# Patient Record
Sex: Female | Born: 1940 | Race: White | Hispanic: No | Marital: Married | Smoking: Former smoker
Health system: Southern US, Community
[De-identification: ages and names within clinical notes are randomized; demographics above are authoritative.]

## PROBLEM LIST (undated history)

## (undated) DIAGNOSIS — R112 Nausea with vomiting, unspecified: Secondary | ICD-10-CM

## (undated) DIAGNOSIS — J984 Other disorders of lung: Secondary | ICD-10-CM

## (undated) DIAGNOSIS — I10 Essential (primary) hypertension: Secondary | ICD-10-CM

## (undated) DIAGNOSIS — T4145XA Adverse effect of unspecified anesthetic, initial encounter: Secondary | ICD-10-CM

## (undated) DIAGNOSIS — M069 Rheumatoid arthritis, unspecified: Secondary | ICD-10-CM

## (undated) DIAGNOSIS — K219 Gastro-esophageal reflux disease without esophagitis: Secondary | ICD-10-CM

## (undated) DIAGNOSIS — C3490 Malignant neoplasm of unspecified part of unspecified bronchus or lung: Secondary | ICD-10-CM

## (undated) DIAGNOSIS — J8410 Pulmonary fibrosis, unspecified: Secondary | ICD-10-CM

## (undated) DIAGNOSIS — I471 Supraventricular tachycardia, unspecified: Secondary | ICD-10-CM

## (undated) DIAGNOSIS — E785 Hyperlipidemia, unspecified: Secondary | ICD-10-CM

## (undated) DIAGNOSIS — T8859XA Other complications of anesthesia, initial encounter: Secondary | ICD-10-CM

## (undated) DIAGNOSIS — K5900 Constipation, unspecified: Secondary | ICD-10-CM

## (undated) DIAGNOSIS — D649 Anemia, unspecified: Secondary | ICD-10-CM

## (undated) DIAGNOSIS — I809 Phlebitis and thrombophlebitis of unspecified site: Secondary | ICD-10-CM

## (undated) DIAGNOSIS — I499 Cardiac arrhythmia, unspecified: Secondary | ICD-10-CM

## (undated) DIAGNOSIS — Z9889 Other specified postprocedural states: Secondary | ICD-10-CM

## (undated) DIAGNOSIS — Z9289 Personal history of other medical treatment: Secondary | ICD-10-CM

## (undated) HISTORY — PX: TONSILLECTOMY: SUR1361

## (undated) HISTORY — PX: COLONOSCOPY: SHX174

## (undated) HISTORY — PX: TRACHEOSTOMY: SUR1362

---

## 2012-01-01 HISTORY — PX: CATARACT EXTRACTION: SUR2

## 2012-12-17 ENCOUNTER — Other Ambulatory Visit: Payer: Self-pay | Admitting: Orthopedic Surgery

## 2013-01-08 ENCOUNTER — Encounter (HOSPITAL_COMMUNITY): Payer: Self-pay | Admitting: Pharmacy Technician

## 2013-01-09 ENCOUNTER — Ambulatory Visit (HOSPITAL_COMMUNITY)
Admission: RE | Admit: 2013-01-09 | Discharge: 2013-01-09 | Disposition: A | Discharge status: Home / Self Care | Payer: Medicare Other | Source: Ambulatory Visit | Attending: Orthopedic Surgery | Admitting: Orthopedic Surgery

## 2013-01-09 ENCOUNTER — Encounter (HOSPITAL_COMMUNITY)
Admission: RE | Admit: 2013-01-09 | Discharge: 2013-01-09 | Disposition: A | Discharge status: Home / Self Care | Payer: Medicare Other | Source: Ambulatory Visit | Attending: Orthopedic Surgery | Admitting: Orthopedic Surgery

## 2013-01-09 ENCOUNTER — Encounter (HOSPITAL_COMMUNITY): Payer: Self-pay

## 2013-01-09 DIAGNOSIS — Z79899 Other long term (current) drug therapy: Secondary | ICD-10-CM | POA: Insufficient documentation

## 2013-01-09 DIAGNOSIS — Z01812 Encounter for preprocedural laboratory examination: Secondary | ICD-10-CM | POA: Insufficient documentation

## 2013-01-09 DIAGNOSIS — Z01810 Encounter for preprocedural cardiovascular examination: Secondary | ICD-10-CM | POA: Insufficient documentation

## 2013-01-09 DIAGNOSIS — M1710 Unilateral primary osteoarthritis, unspecified knee: Secondary | ICD-10-CM | POA: Insufficient documentation

## 2013-01-09 DIAGNOSIS — I471 Supraventricular tachycardia, unspecified: Secondary | ICD-10-CM | POA: Insufficient documentation

## 2013-01-09 HISTORY — DX: Supraventricular tachycardia: I47.1

## 2013-01-09 HISTORY — DX: Hyperlipidemia, unspecified: E78.5

## 2013-01-09 HISTORY — DX: Supraventricular tachycardia, unspecified: I47.10

## 2013-01-09 HISTORY — DX: Gastro-esophageal reflux disease without esophagitis: K21.9

## 2013-01-09 HISTORY — DX: Rheumatoid arthritis, unspecified: M06.9

## 2013-01-09 HISTORY — DX: Essential (primary) hypertension: I10

## 2013-01-09 HISTORY — DX: Pulmonary fibrosis, unspecified: J84.10

## 2013-01-09 HISTORY — DX: Other disorders of lung: J98.4

## 2013-01-09 LAB — BASIC METABOLIC PANEL
Chloride: 99 mEq/L (ref 96–112)
Creatinine, Ser: 0.94 mg/dL (ref 0.50–1.10)
GFR calc Af Amer: 70 mL/min — ABNORMAL LOW (ref >90)
Sodium: 138 mEq/L (ref 135–145)

## 2013-01-09 LAB — CBC
HCT: 37.3 % (ref 36.0–46.0)
Platelets: 303 10*3/uL (ref 150–400)
RBC: 3.62 MIL/uL — ABNORMAL LOW (ref 3.87–5.11)
RDW: 12.3 % (ref 11.5–15.5)
WBC: 7.8 10*3/uL (ref 4.0–10.5)

## 2013-01-09 LAB — SURGICAL PCR SCREEN
MRSA, PCR: NEGATIVE
Staphylococcus aureus: NEGATIVE

## 2013-01-09 LAB — URINALYSIS, ROUTINE W REFLEX MICROSCOPIC
Bilirubin Urine: NEGATIVE
Ketones, ur: NEGATIVE mg/dL
Nitrite: NEGATIVE
Urobilinogen, UA: 0.2 mg/dL (ref 0.0–1.0)

## 2013-01-09 LAB — ABO/RH: ABO/RH(D): A NEG

## 2013-01-09 LAB — TYPE AND SCREEN: Antibody Screen: NEGATIVE

## 2013-01-09 NOTE — Progress Notes (Addendum)
Patient reports that she does not see a cardiologist for her PSVT that her PCP manages it and that she avoids caffeine. Requested records from [facility]

## 2013-01-09 NOTE — Pre-Procedure Instructions (Signed)
Dinorah Masullo  01/09/2013   Your procedure is scheduled on:  January 21  Report to [facility] at 05:30 AM.  Call this number if you have problems the morning of surgery: [phone]   Remember:   Do not eat food or drink liquids after midnight.   Take these medicines the morning of surgery with A SIP OF WATER: Verapamil   STOP Calcium, Multiple vitamins, vitamin E January 14  Do not wear jewelry, make-up or nail polish.  Do not wear lotions, powders, or perfumes. You may wear deodorant.  Do not shave 48 hours prior to surgery. Men may shave face and neck.  Do not bring valuables to the hospital.  Contacts, dentures or bridgework may not be worn into surgery.  Leave suitcase in the car. After surgery it may be brought to your room.  For patients admitted to the hospital, checkout time is 11:00 AM the day of discharge.   Special Instructions: Shower using CHG 2 nights before surgery and the night before surgery.  If you shower the day of surgery use CHG.  Use special wash - you have one bottle of CHG for all showers.  You should use approximately 1/3 of the bottle for each shower.   Please read over the following fact sheets that you were given: Pain Booklet, Coughing and Deep Breathing and Surgical Site Infection Prevention

## 2013-01-11 LAB — URINE CULTURE

## 2013-01-12 NOTE — Anesthesia Preprocedure Evaluation (Addendum)
Anesthesia Evaluation  Patient identified by MRN, date of birth, ID band Patient awake    Reviewed: Allergy & Precautions, H&P , NPO status , Patient's Chart, lab work & pertinent test results  Airway Mallampati: I TM Distance: >3 FB Neck ROM: full    Dental  (+) Teeth Intact and Dental Advisory Given   Pulmonary former smoker,  breath sounds clear to auscultation        Cardiovascular hypertension, Pt. on medications + dysrhythmias Rhythm:regular Rate:Normal     Neuro/Psych    GI/Hepatic GERD-  Medicated and Controlled,  Endo/Other    Renal/GU      Musculoskeletal  (+) Arthritis -, Rheumatoid disorders,    Abdominal   Peds  Hematology   Anesthesia Other Findings   Reproductive/Obstetrics                          Anesthesia Physical Anesthesia Plan  ASA: II  Anesthesia Plan: General   Post-op Pain Management:    Induction: Intravenous  Airway Management Planned: Oral ETT and LMA  Additional Equipment:   Intra-op Plan:   Post-operative Plan: Extubation in OR  Informed Consent: I have reviewed the patients History and Physical, chart, labs and discussed the procedure including the risks, benefits and alternatives for the proposed anesthesia with the patient or authorized representative who has indicated his/her understanding and acceptance.     Plan Discussed with: CRNA, Anesthesiologist and Surgeon  Anesthesia Plan Comments: (Please see my note re: anesthesia concerns due to history of childhood tracheostomy.  Shonna Chock, PA-C)       Anesthesia Quick Evaluation

## 2013-01-12 NOTE — Consult Note (Addendum)
Anesthesia chart review: Patient is a 72 year old female scheduled for left total knee arthroplasty by Dr. August Saucer on 01/21/2012. 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 PCP is from [facility].  Anesthesia type is posted as choice.  Due to her history of tracheostomy, she is concerned about the possible need for ETT.  I did not evaluate her on the day of surgery, but I did speak with her PAT RN while patient was still at her appointment regarding a possibility of spinal anesthesia versus GA with LMA versus GA with ETT (with the potential for use of a smaller ETT and/or specialized equipment such as a glidescope or fiberoptic scope if needed for better visualization.) On the day of surgery, she will talk further with her assigned anesthesiologist regarding her definitve anesthesia plan.  EKG on 01/09/13 showed NSR, cannot rule out anterior infarct (age undetermined).  CXR on 01/09/13 showed:  Prior granulomatous disease. No acute findings. Probable pleural thickening possibly in the oblique fissure on the left. This  should be confirmed with non emergent CT at some point to exclude the unlikely possibility of a mass.  Results called to Selena Batten at Dr. Diamantina Providence office.  She will have him review.  Preoperative labs noted.  Records from PCP requested, but are still pending.  I'll review once available.  Shonna Chock, PA-C 01/13/13 0940  Addendum: 01/13/13 1730 I reviewed records from PCP Dr. Wandalee Ferdinand.  According to his 11/05/12, he was aware of up-coming TKA.  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  He was planning to get a pre-operative CXR, so I did fax a copy of her recent CXR to Dr. Florentina Jenny office and spoke with nurse Jasmine December.  Overall,  I think her EKG appears stable since 06/12/07.  There are no additional cardiac records.  She has not had any sustained arrhythmias and remains on a calcium channel blocker.  Would anticipate she could proceed as planned in no acute changes.

## 2013-01-19 MED ORDER — CEFAZOLIN SODIUM-DEXTROSE 2-3 GM-% IV SOLR
2.0000 g | INTRAVENOUS | Status: AC
  Administered 2013-01-20: 2 g via INTRAVENOUS
  Filled 2013-01-19 (×3): qty 50

## 2013-01-20 ENCOUNTER — Encounter (HOSPITAL_COMMUNITY): Payer: Self-pay | Admitting: Vascular Surgery

## 2013-01-20 ENCOUNTER — Encounter (HOSPITAL_COMMUNITY): Payer: Self-pay

## 2013-01-20 ENCOUNTER — Encounter (HOSPITAL_COMMUNITY)
Admission: RE | Disposition: A | Discharge status: Home Health | Payer: Self-pay | Source: Ambulatory Visit | Attending: Orthopedic Surgery

## 2013-01-20 ENCOUNTER — Inpatient Hospital Stay (HOSPITAL_COMMUNITY)
Admission: RE | Admit: 2013-01-20 | Discharge: 2013-01-23 | DRG: 470 | Disposition: A | Discharge status: Home Health | Payer: Medicare Other | Source: Ambulatory Visit | Attending: Orthopedic Surgery | Admitting: Orthopedic Surgery

## 2013-01-20 ENCOUNTER — Inpatient Hospital Stay (HOSPITAL_COMMUNITY): Payer: Medicare Other | Admitting: Vascular Surgery

## 2013-01-20 DIAGNOSIS — Z888 Allergy status to other drugs, medicaments and biological substances status: Secondary | ICD-10-CM

## 2013-01-20 DIAGNOSIS — K219 Gastro-esophageal reflux disease without esophagitis: Secondary | ICD-10-CM | POA: Diagnosis present

## 2013-01-20 DIAGNOSIS — I471 Supraventricular tachycardia, unspecified: Secondary | ICD-10-CM | POA: Diagnosis present

## 2013-01-20 DIAGNOSIS — Z79899 Other long term (current) drug therapy: Secondary | ICD-10-CM

## 2013-01-20 DIAGNOSIS — E785 Hyperlipidemia, unspecified: Secondary | ICD-10-CM | POA: Diagnosis present

## 2013-01-20 DIAGNOSIS — M1710 Unilateral primary osteoarthritis, unspecified knee: Principal | ICD-10-CM | POA: Diagnosis present

## 2013-01-20 DIAGNOSIS — I10 Essential (primary) hypertension: Secondary | ICD-10-CM | POA: Diagnosis present

## 2013-01-20 DIAGNOSIS — Z87891 Personal history of nicotine dependence: Secondary | ICD-10-CM

## 2013-01-20 HISTORY — PX: TOTAL KNEE ARTHROPLASTY: SHX125

## 2013-01-20 LAB — PROTIME-INR
INR: 1 (ref 0.00–1.49)
Prothrombin Time: 13.1 seconds (ref 11.6–15.2)

## 2013-01-20 SURGERY — ARTHROPLASTY, KNEE, TOTAL
Anesthesia: General | Site: Knee | Laterality: Left | Wound class: Clean

## 2013-01-20 MED ORDER — DIPHENHYDRAMINE HCL 12.5 MG/5ML PO ELIX: 12.5000 mg | ORAL_SOLUTION | Freq: Four times a day (QID) | ORAL | Status: DC | PRN

## 2013-01-20 MED ORDER — NALOXONE HCL 0.4 MG/ML IJ SOLN: 0.4000 mg | INTRAMUSCULAR | Status: DC | PRN

## 2013-01-20 MED ORDER — ONDANSETRON HCL 4 MG/2ML IJ SOLN: 4.0000 mg | Freq: Four times a day (QID) | INTRAMUSCULAR | Status: DC | PRN

## 2013-01-20 MED ORDER — PANTOPRAZOLE SODIUM 40 MG PO TBEC
40.0000 mg | DELAYED_RELEASE_TABLET | Freq: Every day | ORAL | Status: DC
  Administered 2013-01-21 – 2013-01-22 (×2): 40 mg via ORAL
  Filled 2013-01-20 (×2): qty 1

## 2013-01-20 MED ORDER — DEXAMETHASONE SODIUM PHOSPHATE 4 MG/ML IJ SOLN: INTRAMUSCULAR | Status: DC | PRN

## 2013-01-20 MED ORDER — PROMETHAZINE HCL 25 MG/ML IJ SOLN
12.5000 mg | Freq: Four times a day (QID) | INTRAMUSCULAR | Status: DC | PRN
  Administered 2013-01-20: 6.25 mg via INTRAVENOUS

## 2013-01-20 MED ORDER — MIDAZOLAM HCL 5 MG/5ML IJ SOLN
INTRAMUSCULAR | Status: DC | PRN
  Administered 2013-01-20 (×2): 1 mg via INTRAVENOUS

## 2013-01-20 MED ORDER — POTASSIUM CHLORIDE IN NACL 20-0.9 MEQ/L-% IV SOLN
INTRAVENOUS | Status: DC
  Administered 2013-01-20: 15:00:00 via INTRAVENOUS
  Filled 2013-01-20 (×2): qty 1000

## 2013-01-20 MED ORDER — ROCURONIUM BROMIDE 100 MG/10ML IV SOLN
INTRAVENOUS | Status: DC | PRN
  Administered 2013-01-20: 30 mg via INTRAVENOUS

## 2013-01-20 MED ORDER — LIDOCAINE HCL (CARDIAC) 20 MG/ML IV SOLN
INTRAVENOUS | Status: DC | PRN
  Administered 2013-01-20: 80 mg via INTRAVENOUS

## 2013-01-20 MED ORDER — MENTHOL 3 MG MT LOZG: 1.0000 | LOZENGE | OROMUCOSAL | Status: DC | PRN

## 2013-01-20 MED ORDER — TRIAMTERENE-HCTZ 37.5-25 MG PO TABS
1.0000 | ORAL_TABLET | Freq: Every day | ORAL | Status: DC
  Administered 2013-01-21 – 2013-01-23 (×3): 1 via ORAL
  Filled 2013-01-20 (×4): qty 1

## 2013-01-20 MED ORDER — CLONIDINE HCL (ANALGESIA) 100 MCG/ML EP SOLN
EPIDURAL | Status: DC | PRN
  Administered 2013-01-20: .9 mL via INTRA_ARTICULAR

## 2013-01-20 MED ORDER — VERAPAMIL HCL ER 240 MG PO TBCR
240.0000 mg | EXTENDED_RELEASE_TABLET | Freq: Every day | ORAL | Status: DC
  Administered 2013-01-21 – 2013-01-23 (×3): 240 mg via ORAL
  Filled 2013-01-20 (×3): qty 1

## 2013-01-20 MED ORDER — DEXTROSE 5 % IV SOLN
500.0000 mg | Freq: Four times a day (QID) | INTRAVENOUS | Status: DC | PRN
  Administered 2013-01-20: 500 mg via INTRAVENOUS
  Filled 2013-01-20: qty 5

## 2013-01-20 MED ORDER — SODIUM CHLORIDE 0.9 % IR SOLN
Status: DC | PRN
  Administered 2013-01-20: 3000 mL

## 2013-01-20 MED ORDER — ACETAMINOPHEN 325 MG PO TABS
650.0000 mg | ORAL_TABLET | Freq: Four times a day (QID) | ORAL | Status: DC | PRN
  Administered 2013-01-22 (×3): 650 mg via ORAL
  Filled 2013-01-20 (×3): qty 2

## 2013-01-20 MED ORDER — WARFARIN SODIUM 5 MG PO TABS
5.0000 mg | ORAL_TABLET | Freq: Once | ORAL | Status: AC
  Administered 2013-01-20: 5 mg via ORAL
  Filled 2013-01-20: qty 1

## 2013-01-20 MED ORDER — LIDOCAINE HCL 4 % MT SOLN
OROMUCOSAL | Status: DC | PRN
  Administered 2013-01-20: 4 mL via TOPICAL

## 2013-01-20 MED ORDER — WARFARIN VIDEO: Freq: Once | Status: DC

## 2013-01-20 MED ORDER — SODIUM CHLORIDE 0.9 % IJ SOLN: 9.0000 mL | INTRAMUSCULAR | Status: DC | PRN

## 2013-01-20 MED ORDER — ONDANSETRON HCL 4 MG/2ML IJ SOLN
4.0000 mg | Freq: Once | INTRAMUSCULAR | Status: DC | PRN
Start: 2013-01-20 — End: 2013-01-20

## 2013-01-20 MED ORDER — BUPIVACAINE HCL (PF) 0.5 % IJ SOLN
INTRAMUSCULAR | Status: AC
  Filled 2013-01-20: qty 30

## 2013-01-20 MED ORDER — METOCLOPRAMIDE HCL 5 MG/ML IJ SOLN: 5.0000 mg | Freq: Three times a day (TID) | INTRAMUSCULAR | Status: DC | PRN

## 2013-01-20 MED ORDER — PHENOL 1.4 % MT LIQD: 1.0000 | OROMUCOSAL | Status: DC | PRN

## 2013-01-20 MED ORDER — BUPIVACAINE HCL (PF) 0.5 % IJ SOLN
INTRAMUSCULAR | Status: DC | PRN
  Administered 2013-01-20: 30 mL via INTRA_ARTICULAR

## 2013-01-20 MED ORDER — BUPIVACAINE-EPINEPHRINE PF 0.5-1:200000 % IJ SOLN
INTRAMUSCULAR | Status: AC
  Filled 2013-01-20: qty 30

## 2013-01-20 MED ORDER — COUMADIN BOOK
Freq: Once | Status: AC
  Administered 2013-01-20: 17:00:00
  Filled 2013-01-20: qty 1

## 2013-01-20 MED ORDER — CLONIDINE HCL (ANALGESIA) 100 MCG/ML EP SOLN
150.0000 ug | Freq: Once | EPIDURAL | Status: DC
  Filled 2013-01-20: qty 1.5

## 2013-01-20 MED ORDER — 0.9 % SODIUM CHLORIDE (POUR BTL) OPTIME
TOPICAL | Status: DC | PRN
  Administered 2013-01-20: 1000 mL

## 2013-01-20 MED ORDER — CALCIUM CARBONATE-VITAMIN D 500-200 MG-UNIT PO TABS
2.0000 | ORAL_TABLET | Freq: Every day | ORAL | Status: DC
  Administered 2013-01-21 – 2013-01-23 (×3): 2 via ORAL
  Filled 2013-01-20 (×3): qty 2

## 2013-01-20 MED ORDER — ACETAMINOPHEN 10 MG/ML IV SOLN
INTRAVENOUS | Status: AC
  Administered 2013-01-20: 1000 mg via INTRAVENOUS
  Filled 2013-01-20: qty 100

## 2013-01-20 MED ORDER — GLYCOPYRROLATE 0.2 MG/ML IJ SOLN
INTRAMUSCULAR | Status: DC | PRN
  Administered 2013-01-20: 0.4 mg via INTRAVENOUS

## 2013-01-20 MED ORDER — ONDANSETRON HCL 4 MG/2ML IJ SOLN
INTRAMUSCULAR | Status: DC | PRN
  Administered 2013-01-20 (×2): 4 mg via INTRAVENOUS

## 2013-01-20 MED ORDER — SCOPOLAMINE 1 MG/3DAYS TD PT72
1.0000 | MEDICATED_PATCH | Freq: Once | TRANSDERMAL | Status: DC
  Administered 2013-01-20: 1.5 mg via TRANSDERMAL

## 2013-01-20 MED ORDER — HYDROMORPHONE HCL PF 1 MG/ML IJ SOLN: 0.2500 mg | INTRAMUSCULAR | Status: DC | PRN

## 2013-01-20 MED ORDER — WARFARIN - PHARMACIST DOSING INPATIENT: Freq: Every day | Status: DC

## 2013-01-20 MED ORDER — MORPHINE SULFATE (PF) 1 MG/ML IV SOLN
INTRAVENOUS | Status: AC
  Filled 2013-01-20: qty 25

## 2013-01-20 MED ORDER — SIMVASTATIN 20 MG PO TABS
20.0000 mg | ORAL_TABLET | Freq: Every evening | ORAL | Status: DC
  Filled 2013-01-20: qty 1

## 2013-01-20 MED ORDER — ATORVASTATIN CALCIUM 10 MG PO TABS
10.0000 mg | ORAL_TABLET | Freq: Every day | ORAL | Status: DC
  Administered 2013-01-22: 10 mg via ORAL
  Filled 2013-01-20 (×4): qty 1

## 2013-01-20 MED ORDER — LACTATED RINGERS IV SOLN
INTRAVENOUS | Status: DC | PRN
  Administered 2013-01-20 (×2): via INTRAVENOUS

## 2013-01-20 MED ORDER — MORPHINE SULFATE 4 MG/ML IJ SOLN
INTRAMUSCULAR | Status: AC
  Filled 2013-01-20: qty 2

## 2013-01-20 MED ORDER — METOCLOPRAMIDE HCL 10 MG PO TABS: 5.0000 mg | ORAL_TABLET | Freq: Three times a day (TID) | ORAL | Status: DC | PRN

## 2013-01-20 MED ORDER — ARTIFICIAL TEARS OP OINT
TOPICAL_OINTMENT | OPHTHALMIC | Status: DC | PRN
  Administered 2013-01-20: 1 via OPHTHALMIC

## 2013-01-20 MED ORDER — MORPHINE SULFATE (PF) 1 MG/ML IV SOLN
INTRAVENOUS | Status: DC
  Administered 2013-01-20: 4 mg via INTRAVENOUS
  Administered 2013-01-21: 08:00:00 via INTRAVENOUS
  Administered 2013-01-21: 4 mg via INTRAVENOUS
  Filled 2013-01-20: qty 25

## 2013-01-20 MED ORDER — ONDANSETRON HCL 4 MG/2ML IJ SOLN
INTRAMUSCULAR | Status: AC
  Administered 2013-01-20: 2 mg
  Filled 2013-01-20: qty 2

## 2013-01-20 MED ORDER — DIPHENHYDRAMINE HCL 50 MG/ML IJ SOLN: 12.5000 mg | Freq: Four times a day (QID) | INTRAMUSCULAR | Status: DC | PRN

## 2013-01-20 MED ORDER — MORPHINE SULFATE 4 MG/ML IJ SOLN
INTRAMUSCULAR | Status: DC | PRN
  Administered 2013-01-20: 8 mg via INTRAVENOUS

## 2013-01-20 MED ORDER — OXYCODONE HCL 5 MG PO TABS
5.0000 mg | ORAL_TABLET | ORAL | Status: DC | PRN
  Administered 2013-01-21: 10 mg via ORAL
  Administered 2013-01-21 – 2013-01-22 (×4): 5 mg via ORAL
  Administered 2013-01-22 (×3): 10 mg via ORAL
  Administered 2013-01-22: 5 mg via ORAL
  Administered 2013-01-23: 10 mg via ORAL
  Administered 2013-01-23: 5 mg via ORAL
  Filled 2013-01-20 (×3): qty 1
  Filled 2013-01-20 (×2): qty 2
  Filled 2013-01-20: qty 1
  Filled 2013-01-20: qty 2
  Filled 2013-01-20 (×2): qty 1
  Filled 2013-01-20 (×2): qty 2

## 2013-01-20 MED ORDER — CEFAZOLIN SODIUM 1-5 GM-% IV SOLN
1.0000 g | Freq: Three times a day (TID) | INTRAVENOUS | Status: AC
  Administered 2013-01-20 (×2): 1 g via INTRAVENOUS
  Filled 2013-01-20 (×2): qty 50

## 2013-01-20 MED ORDER — CHLORHEXIDINE GLUCONATE 4 % EX LIQD: 60.0000 mL | Freq: Once | CUTANEOUS | Status: DC

## 2013-01-20 MED ORDER — PROMETHAZINE HCL 25 MG/ML IJ SOLN
INTRAMUSCULAR | Status: AC
  Filled 2013-01-20: qty 1

## 2013-01-20 MED ORDER — ONDANSETRON HCL 4 MG/2ML IJ SOLN: 2.0000 mg | Freq: Once | INTRAMUSCULAR | Status: DC

## 2013-01-20 MED ORDER — MORPHINE SULFATE (PF) 1 MG/ML IV SOLN
INTRAVENOUS | Status: DC
  Administered 2013-01-20: 12:00:00 via INTRAVENOUS

## 2013-01-20 MED ORDER — NEOSTIGMINE METHYLSULFATE 1 MG/ML IJ SOLN
INTRAMUSCULAR | Status: DC | PRN
  Administered 2013-01-20: 3 mg via INTRAVENOUS

## 2013-01-20 MED ORDER — ONDANSETRON HCL 4 MG PO TABS: 4.0000 mg | ORAL_TABLET | Freq: Four times a day (QID) | ORAL | Status: DC | PRN

## 2013-01-20 MED ORDER — ONDANSETRON HCL 4 MG/2ML IJ SOLN: 4.0000 mg | Freq: Once | INTRAMUSCULAR | Status: DC | PRN

## 2013-01-20 MED ORDER — MIDAZOLAM HCL 5 MG/5ML IJ SOLN: INTRAMUSCULAR | Status: DC | PRN

## 2013-01-20 MED ORDER — FENTANYL CITRATE 0.05 MG/ML IJ SOLN
INTRAMUSCULAR | Status: DC | PRN
  Administered 2013-01-20 (×8): 50 ug via INTRAVENOUS
  Administered 2013-01-20: 100 ug via INTRAVENOUS

## 2013-01-20 MED ORDER — METHOCARBAMOL 500 MG PO TABS: 500.0000 mg | ORAL_TABLET | Freq: Four times a day (QID) | ORAL | Status: DC | PRN

## 2013-01-20 MED ORDER — PROPOFOL 10 MG/ML IV BOLUS
INTRAVENOUS | Status: DC | PRN
  Administered 2013-01-20: 160 mg via INTRAVENOUS

## 2013-01-20 MED ORDER — DEXAMETHASONE SODIUM PHOSPHATE 4 MG/ML IJ SOLN
INTRAMUSCULAR | Status: DC | PRN
  Administered 2013-01-20: 4 mg via INTRAVENOUS

## 2013-01-20 MED ORDER — ADULT MULTIVITAMIN W/MINERALS CH
1.0000 | ORAL_TABLET | Freq: Two times a day (BID) | ORAL | Status: DC
  Administered 2013-01-21 – 2013-01-23 (×5): 1 via ORAL
  Filled 2013-01-20 (×7): qty 1

## 2013-01-20 MED ORDER — ACETAMINOPHEN 650 MG RE SUPP: 650.0000 mg | Freq: Four times a day (QID) | RECTAL | Status: DC | PRN

## 2013-01-20 MED ORDER — CALCIUM 1200 1200-1000 MG-UNIT PO CHEW: CHEWABLE_TABLET | Freq: Every morning | ORAL | Status: DC

## 2013-01-20 SURGICAL SUPPLY — 73 items
BANDAGE ELASTIC 4 VELCRO ST LF (GAUZE/BANDAGES/DRESSINGS) ×2 IMPLANT
BANDAGE ELASTIC 6 VELCRO ST LF (GAUZE/BANDAGES/DRESSINGS) ×2 IMPLANT
BANDAGE ESMARK 6X9 LF (GAUZE/BANDAGES/DRESSINGS) ×1 IMPLANT
BLADE SAG 18X100X1.27 (BLADE) ×2 IMPLANT
BLADE SAW SGTL 13.0X1.19X90.0M (BLADE) IMPLANT
BNDG COHESIVE 6X5 TAN STRL LF (GAUZE/BANDAGES/DRESSINGS) ×2 IMPLANT
BNDG ELASTIC 6X10 VLCR STRL LF (GAUZE/BANDAGES/DRESSINGS) ×6 IMPLANT
BNDG ESMARK 6X9 LF (GAUZE/BANDAGES/DRESSINGS) ×2
BOWL SMART MIX CTS (DISPOSABLE) ×2 IMPLANT
CEMENT BONE SIMPLEX SPEEDSET (Cement) ×4 IMPLANT
CLOTH BEACON ORANGE TIMEOUT ST (SAFETY) ×2 IMPLANT
COVER BACK TABLE 24X17X13 BIG (DRAPES) ×2 IMPLANT
COVER SURGICAL LIGHT HANDLE (MISCELLANEOUS) ×2 IMPLANT
CUFF TOURNIQUET SINGLE 34IN LL (TOURNIQUET CUFF) ×2 IMPLANT
CUFF TOURNIQUET SINGLE 44IN (TOURNIQUET CUFF) IMPLANT
DRAPE INCISE IOBAN 66X45 STRL (DRAPES) IMPLANT
DRAPE ORTHO SPLIT 77X108 STRL (DRAPES) ×3
DRAPE PROXIMA HALF (DRAPES) ×2 IMPLANT
DRAPE SURG ORHT 6 SPLT 77X108 (DRAPES) ×3 IMPLANT
DRAPE U-SHAPE 47X51 STRL (DRAPES) ×2 IMPLANT
DRAPE X-RAY CASS 24X20 (DRAPES) IMPLANT
DRSG PAD ABDOMINAL 8X10 ST (GAUZE/BANDAGES/DRESSINGS) ×2 IMPLANT
DURAPREP 26ML APPLICATOR (WOUND CARE) ×2 IMPLANT
ELECT REM PT RETURN 9FT ADLT (ELECTROSURGICAL) ×2
ELECTRODE REM PT RTRN 9FT ADLT (ELECTROSURGICAL) ×1 IMPLANT
EVACUATOR 1/8 PVC DRAIN (DRAIN) ×2 IMPLANT
FACESHIELD LNG OPTICON STERILE (SAFETY) ×2 IMPLANT
GAUZE XEROFORM 5X9 LF (GAUZE/BANDAGES/DRESSINGS) ×2 IMPLANT
GLOVE BIO SURGEON ST LM GN SZ9 (GLOVE) IMPLANT
GLOVE BIOGEL PI IND STRL 8 (GLOVE) ×1 IMPLANT
GLOVE BIOGEL PI INDICATOR 8 (GLOVE) ×1
GLOVE SURG ORTHO 8.0 STRL STRW (GLOVE) ×2 IMPLANT
GOWN PREVENTION PLUS LG XLONG (DISPOSABLE) ×2 IMPLANT
GOWN PREVENTION PLUS XLARGE (GOWN DISPOSABLE) ×6 IMPLANT
GOWN STRL NON-REIN LRG LVL3 (GOWN DISPOSABLE) ×6 IMPLANT
HANDPIECE INTERPULSE COAX TIP (DISPOSABLE) ×1
HOOD PEEL AWAY FACE SHEILD DIS (HOOD) ×10 IMPLANT
IMMOBILIZER KNEE 20 (SOFTGOODS)
IMMOBILIZER KNEE 20 THIGH 36 (SOFTGOODS) IMPLANT
IMMOBILIZER KNEE 22 UNIV (SOFTGOODS) ×2 IMPLANT
IMMOBILIZER KNEE 24 THIGH 36 (MISCELLANEOUS) IMPLANT
IMMOBILIZER KNEE 24 UNIV (MISCELLANEOUS)
KIT BASIN OR (CUSTOM PROCEDURE TRAY) ×2 IMPLANT
KIT ROOM TURNOVER OR (KITS) ×2 IMPLANT
MANIFOLD NEPTUNE II (INSTRUMENTS) ×2 IMPLANT
MARKER SPHERE PSV REFLC THRD 5 (MARKER) IMPLANT
NEEDLE 18GX1X1/2 (RX/OR ONLY) (NEEDLE) ×2 IMPLANT
NEEDLE SPNL 18GX3.5 QUINCKE PK (NEEDLE) ×2 IMPLANT
NS IRRIG 1000ML POUR BTL (IV SOLUTION) ×2 IMPLANT
PACK TOTAL JOINT (CUSTOM PROCEDURE TRAY) ×2 IMPLANT
PAD ARMBOARD 7.5X6 YLW CONV (MISCELLANEOUS) ×4 IMPLANT
PAD CAST 4YDX4 CTTN HI CHSV (CAST SUPPLIES) ×1 IMPLANT
PADDING CAST COTTON 4X4 STRL (CAST SUPPLIES) ×1
PADDING CAST COTTON 6X4 STRL (CAST SUPPLIES) ×2 IMPLANT
PIN SCHANZ 4MM 130MM (PIN) IMPLANT
RUBBERBAND STERILE (MISCELLANEOUS) ×2 IMPLANT
SET HNDPC FAN SPRY TIP SCT (DISPOSABLE) ×1 IMPLANT
SPONGE GAUZE 4X4 12PLY (GAUZE/BANDAGES/DRESSINGS) ×2 IMPLANT
SPONGE LAP 18X18 X RAY DECT (DISPOSABLE) IMPLANT
STAPLER VISISTAT 35W (STAPLE) ×2 IMPLANT
SUCTION FRAZIER TIP 10 FR DISP (SUCTIONS) ×2 IMPLANT
SUT ETHILON 3 0 PS 1 (SUTURE) ×4 IMPLANT
SUT VIC AB 0 CTB1 27 (SUTURE) ×6 IMPLANT
SUT VIC AB 1 CT1 27 (SUTURE) ×5
SUT VIC AB 1 CT1 27XBRD ANBCTR (SUTURE) ×5 IMPLANT
SUT VIC AB 2-0 CT1 27 (SUTURE) ×3
SUT VIC AB 2-0 CT1 TAPERPNT 27 (SUTURE) ×3 IMPLANT
SYR 30ML SLIP (SYRINGE) ×2 IMPLANT
SYR TB 1ML LUER SLIP (SYRINGE) ×2 IMPLANT
TOWEL OR 17X24 6PK STRL BLUE (TOWEL DISPOSABLE) ×2 IMPLANT
TOWEL OR 17X26 10 PK STRL BLUE (TOWEL DISPOSABLE) ×4 IMPLANT
TRAY FOLEY CATH 14FR (SET/KITS/TRAYS/PACK) ×2 IMPLANT
WATER STERILE IRR 1000ML POUR (IV SOLUTION) ×6 IMPLANT

## 2013-01-20 NOTE — Anesthesia Procedure Notes (Signed)
Procedure Name: Intubation Date/Time: 01/20/2013 7:43 AM Performed by: Lovie Chol Pre-anesthesia Checklist: Patient identified, Emergency Drugs available, Suction available, Patient being monitored and Timeout performed Patient Re-evaluated:Patient Re-evaluated prior to inductionOxygen Delivery Method: Circle system utilized Preoxygenation: Pre-oxygenation with 100% oxygen Intubation Type: IV induction Ventilation: Mask ventilation without difficulty Laryngoscope Size: Miller and 2 Grade View: Grade II Tube type: Oral Tube size: 6.5 mm Number of attempts: 1 Airway Equipment and Method: Stylet and LTA kit utilized Placement Confirmation: ETT inserted through vocal cords under direct vision,  positive ETCO2,  CO2 detector and breath sounds checked- equal and bilateral Secured at: 22 cm Tube secured with: Tape Dental Injury: Teeth and Oropharynx as per pre-operative assessment

## 2013-01-20 NOTE — Progress Notes (Signed)
Orthopedic Tech Progress Note Patient Details:  Kristine Horn Feb 28, 1941 [id number]  CPM Left Knee CPM Left Knee: On Left Knee Flexion (Degrees): 45  Left Knee Extension (Degrees): 0  Applied overhead frame.  [name] 01/20/2013, 8:55 PM

## 2013-01-20 NOTE — H&P (Signed)
TOTAL KNEE ADMISSION H&P  Patient is being admitted for left total knee arthroplasty.  Subjective:  Chief Complaint:left knee pain.  HPI: Kristine Horn, 72 y.o. female, has a history of pain and functional disability in the left knee due to arthritis and has failed non-surgical conservative treatments for greater than 12 weeks to includeNSAID's and/or analgesics, corticosteriod injections, flexibility and strengthening excercises, use of assistive devices and activity modification.  Onset of symptoms was gradual, starting 6 years ago with gradually worsening course since that time. The patient noted no past surgery on the left knee(s).  Patient currently rates pain in the left knee(s) at 8 out of 10 with activity. Patient has worsening of pain with activity and weight bearing, pain that interferes with activities of daily living, pain with passive range of motion, crepitus and joint swelling.  Patient has evidence of subchondral cysts, subchondral sclerosis and joint space narrowing by imaging studies. This patient has had  . There is no active infection.  There are no active problems to display for this patient.  Past Medical History  Diagnosis Date  . GERD (gastroesophageal reflux disease)   . Hypertension   . Hyperlipidemia   . Pulmonary granulomatosis   . Paroxysmal SVT (supraventricular tachycardia)     Sees [facility]  . RHA (rheumatoid arthritis)     Past Surgical History  Procedure Date  . Tracheostomy     Age 47  . Tonsillectomy   . Cataract extraction 01/2012    left eye    Prescriptions prior to admission  Medication Sig Dispense Refill  . Calcium Carbonate-Vit D-Min (CALCIUM 1200 PO) Take 1,200 mg elemental calcium/kg/hr by mouth 2 (two) times daily.      . hydroxychloroquine (PLAQUENIL) 200 MG tablet Take 200 mg by mouth 2 (two) times daily.      Marland Kitchen ibuprofen (ADVIL,MOTRIN) 200 MG tablet Take 600 mg by mouth every 6 (six) hours as needed.      .  methotrexate (RHEUMATREX) 2.5 MG tablet Take 15 mg by mouth once a week. sundays      . Multiple Vitamin (MULTIVITAMIN WITH MINERALS) TABS Take 1 tablet by mouth 2 (two) times daily.      Marland Kitchen omeprazole (PRILOSEC) 40 MG capsule Take 40 mg by mouth as needed.      . simvastatin (ZOCOR) 20 MG tablet Take 20 mg by mouth every evening.      . triamterene-hydrochlorothiazide (MAXZIDE-25) 37.5-25 MG per tablet Take 1 tablet by mouth daily.      . verapamil (CALAN-SR) 240 MG CR tablet Take 240 mg by mouth daily.      . vitamin E 400 UNIT capsule Take 400 Units by mouth daily.       Allergies  Allergen Reactions  . Indocin (Indomethacin) Other (See Comments)    Long acting causes migraines.   . Percodan (Oxycodone-Aspirin) Nausea And Vomiting    Can take percocet    History  Substance Use Topics  . Smoking status: Former Smoker -- 0.5 packs/day for 30 years    Types: Cigarettes    Quit date: 12/31/1977  . Smokeless tobacco: Not on file  . Alcohol Use: Yes     Comment: Drink wine 1 a month    No family history on file.   Review of Systems  Constitutional: Negative.   HENT: Negative.   Eyes: Negative.   Respiratory: Negative.   Cardiovascular: Negative.   Gastrointestinal: Negative.   Genitourinary: Negative.   Musculoskeletal: Positive for  joint pain.  Skin: Negative.   Neurological: Negative.   Endo/Heme/Allergies: Negative.   Psychiatric/Behavioral: Negative.     Objective:  Physical Exam  Constitutional: She appears well-developed.  HENT:  Head: Normocephalic.  Eyes: Pupils are equal, round, and reactive to light.  Neck: Normal range of motion.  Cardiovascular: Normal rate.   Respiratory: Effort normal.  GI: Soft.  Neurological: She is alert.  Skin: Skin is warm.  rom 0 120 - stable to v/v stress - dp 2/4 - df/pf intact - pf crepitus to prom - skin ok  Vital signs in last 24 hours: Temp:  [98 F (36.7 C)] 98 F (36.7 C) (01/21 2130) Pulse Rate:  [75] 75  (01/21  0614) Resp:  [18] 18  (01/21 0614) BP: (144)/(82) 144/82 mmHg (01/21 0614) SpO2:  [98 %] 98 % (01/21 8657)  Labs:   There is no height or weight on file to calculate BMI.   Imaging Review Plain radiographs demonstrate severe degenerative joint disease of the left knee(s). The overall alignment ismild varus. The bone quality appears to be fair for age and reported activity level.  Assessment/Plan:  End stage arthritis, left knee   The patient history, physical examination, clinical judgment of the provider and imaging studies are consistent with end stage degenerative joint disease of the left knee(s) and total knee arthroplasty is deemed medically necessary. The treatment options including medical management, injection therapy arthroscopy and arthroplasty were discussed at length. The risks and benefits of total knee arthroplasty were presented and reviewed. The risks due to aseptic loosening, infection, stiffness, patella tracking problems, thromboembolic complications and other imponderables were discussed. The patient acknowledged the explanation, agreed to proceed with the plan and consent was signed. Patient is being admitted for inpatient treatment for surgery, pain control, PT, OT, prophylactic antibiotics, VTE prophylaxis, progressive ambulation and ADL's and discharge planning. The patient is planning to be discharged home with home health services

## 2013-01-20 NOTE — Preoperative (Signed)
Beta Blockers   Reason not to administer Beta Blockers:Not Applicable 

## 2013-01-20 NOTE — Progress Notes (Signed)
ANTICOAGULATION CONSULT NOTE - Initial Consult  Pharmacy Consult for Coumadin Indication: VTE prophylaxis  Allergies  Allergen Reactions  . Indocin (Indomethacin) Other (See Comments)    Long acting causes migraines.   . Percodan (Oxycodone-Aspirin) Nausea And Vomiting    Can take percocet    Patient Measurements: Height: 5\' 7"  (170.2 cm) Weight: 180 lb 14.4 oz (82.056 kg) IBW/kg (Calculated) : 61.6   Vital Signs: Temp: 97.6 F (36.4 C) (01/21 1524) Temp src: Oral (01/21 0614) BP: 128/60 mmHg (01/21 1524) Pulse Rate: 68  (01/21 1524)  Labs:  Basename 01/20/13 1528  HGB --  HCT --  PLT --  APTT --  LABPROT 13.1  INR 1.00  HEPARINUNFRC --  CREATININE --  CKTOTAL --  CKMB --  TROPONINI --    Estimated Creatinine Clearance: 61.4 ml/min (by C-G formula based on Cr of 0.94).   Medical History: Past Medical History  Diagnosis Date  . GERD (gastroesophageal reflux disease)   . Hypertension   . Hyperlipidemia   . Pulmonary granulomatosis   . Paroxysmal SVT (supraventricular tachycardia)     Se[facility]  . RHA (rheumatoid arthritis)     Medications:  Prescriptions prior to admission  Medication Sig Dispense Refill  . Calcium Carbonate-Vit D-Min (CALCIUM 1200 PO) Take 1,200 mg elemental calcium/kg/hr by mouth 2 (two) times daily.      . hydroxychloroquine (PLAQUENIL) 200 MG tablet Take 200 mg by mouth 2 (two) times daily.      Marland Kitchen ibuprofen (ADVIL,MOTRIN) 200 MG tablet Take 600 mg by mouth every 6 (six) hours as needed.      . methotrexate (RHEUMATREX) 2.5 MG tablet Take 15 mg by mouth once a week. sundays      . Multiple Vitamin (MULTIVITAMIN WITH MINERALS) TABS Take 1 tablet by mouth 2 (two) times daily.      Marland Kitchen omeprazole (PRILOSEC) 40 MG capsule Take 40 mg by mouth as needed.      . simvastatin (ZOCOR) 20 MG tablet Take 20 mg by mouth every evening.      . triamterene-hydrochlorothiazide (MAXZIDE-25) 37.5-25 MG per tablet Take 1 tablet by  mouth daily.      . verapamil (CALAN-SR) 240 MG CR tablet Take 240 mg by mouth daily.      . vitamin E 400 UNIT capsule Take 400 Units by mouth daily.        Assessment: 72 y/o female to begin Coumadin s/p L TKA for VTE prophylaxis. INR and CBC normal at baseline. Coumadin score is 2.  Goal of Therapy:  INR 2-3   Plan:  -Coumadin 5 mg po tonight -INR daily -Monitor for s/sx of bleeding -Coumadin book and video  [facility], [address].D., BCPS Clinical Pharmacist Pager: [phone] 01/20/2013 4:11 PM

## 2013-01-20 NOTE — Transfer of Care (Signed)
Immediate Anesthesia Transfer of Care Note  Patient: Kristine Horn  Procedure(s) Performed: Procedure(s) (LRB) with comments: TOTAL KNEE ARTHROPLASTY (Left) - Left total Knee Arthroplasty  Patient Location: PACU  Anesthesia Type:General  Level of Consciousness: awake, alert , oriented and patient cooperative  Airway & Oxygen Therapy: Patient Spontanous Breathing and Patient connected to nasal cannula oxygen  Post-op Assessment: Report given to PACU RN and Post -op Vital signs reviewed and stable  Post vital signs: Reviewed and stable  Complications: No apparent anesthesia complications

## 2013-01-20 NOTE — Brief Op Note (Signed)
01/20/2013  10:22 AM  PATIENT:  Kristine Horn  72 y.o. female  PRE-OPERATIVE DIAGNOSIS:  Left Knee Arthritis  POST-OPERATIVE DIAGNOSIS:  Left Knee Arthritis  PROCEDURE:  Procedure(s): TOTAL KNEE ARTHROPLASTY  SURGEON:  Surgeon(s): Cammy Copa, MD  ASSISTANT: B roberts  ANESTHESIA:   general  EBL: 100 ml    Total I/O In: 1250 [I.V.:1250] Out: 450 [Urine:450]  BLOOD ADMINISTERED: none  DRAINS: (r) Hemovact drain(s) in the knee with  Suction Clamped   LOCAL MEDICATIONS USED:  none  SPECIMEN:  No Specimen  COUNTS:  YES  TOURNIQUET:   Total Tourniquet Time Documented: Thigh (Left) - 87 minutes  DICTATION: .Other Dictation: Dictation Number [id number]  PLAN OF CARE: Admit to inpatient   PATIENT DISPOSITION:  PACU - hemodynamically stable

## 2013-01-20 NOTE — Anesthesia Postprocedure Evaluation (Signed)
  Anesthesia Post-op Note  Patient: Kristine Horn  Procedure(s) Performed: Procedure(s) (LRB) with comments: TOTAL KNEE ARTHROPLASTY (Left) - Left total Knee Arthroplasty  Patient Location: PACU  Anesthesia Type:General  Level of Consciousness: awake, alert , oriented and patient cooperative  Airway and Oxygen Therapy: Patient Spontanous Breathing  Post-op Pain: mild  Post-op Assessment: Post-op Vital signs reviewed, Patient's Cardiovascular Status Stable, Respiratory Function Stable, Patent Airway, NAUSEA AND VOMITING PRESENT and Pain level controlled  Post-op Vital Signs: stable  Complications: No apparent anesthesia complications

## 2013-01-20 NOTE — Progress Notes (Signed)
Lunch relief by S. Gregson RN 

## 2013-01-21 LAB — PROTIME-INR: Prothrombin Time: 15 seconds (ref 11.6–15.2)

## 2013-01-21 LAB — CBC
MCH: 34.4 pg — ABNORMAL HIGH (ref 26.0–34.0)
MCV: 101.5 fL — ABNORMAL HIGH (ref 78.0–100.0)
Platelets: 191 10*3/uL (ref 150–400)
RDW: 12.9 % (ref 11.5–15.5)

## 2013-01-21 LAB — BASIC METABOLIC PANEL
Calcium: 8.6 mg/dL (ref 8.4–10.5)
Creatinine, Ser: 0.94 mg/dL (ref 0.50–1.10)
GFR calc Af Amer: 70 mL/min — ABNORMAL LOW (ref >90)
GFR calc non Af Amer: 60 mL/min — ABNORMAL LOW (ref >90)

## 2013-01-21 MED ORDER — WARFARIN SODIUM 5 MG PO TABS
5.0000 mg | ORAL_TABLET | Freq: Once | ORAL | Status: AC
  Administered 2013-01-21: 5 mg via ORAL
  Filled 2013-01-21: qty 1

## 2013-01-21 NOTE — Progress Notes (Signed)
CARE MANAGEMENT NOTE 01/21/2013  Patient:  Kristine Horn, Kristine Horn   Account Number:  [id number]  Date Initiated:  01/21/2013  Documentation initiated by:  Vance Peper  Subjective/Objective Assessment:   72 yr old female s/p left total knee arthroplasty.     Action/Plan:   CM spoke with patient and husband concerning home health and DME. Patient preoperatively setup with [facility], no changes. rolling walker, 3in1 and CPM have been delivered.   Anticipated DC Date:  01/22/2013   Anticipated DC Plan:  HOME W HOME HEALTH SERVICES      DC Planning Services  CM consult      [facility] Choice  HOME HEALTH   Choice offered to / List presented to:  C-3 Spouse        HH arranged  HH-1 RN  HH-2 PT      [facility] agency  [facility]   Status of service:  Completed, signed off Medicare Important Message given?   (If response is "NO", the following Medicare IM given date fields will be blank) Date Medicare IM given:   Date Additional Medicare IM given:    Discharge Disposition:  HOME W HOME HEALTH SERVICES  Per UR Regulation:    If discussed at Long Length of Stay Meetings, dates discussed:    Comments:

## 2013-01-21 NOTE — Evaluation (Signed)
Physical Therapy Evaluation Patient Details Name: Kristine Horn MRN: [id number] DOB: Jun 09, 1941 Today's Date: 01/21/2013 Time: 4098-1191 PT Time Calculation (min): 37 min  PT Assessment / Plan / Recommendation Clinical Impression  pt presents with L TKA.  pt very painful, but motivated to improve mobility.  pt will have good help at home.      PT Assessment  Patient needs continued PT services    Follow Up Recommendations  Home health PT;Supervision/Assistance - 24 hour    Does the patient have the potential to tolerate intense rehabilitation      Barriers to Discharge None      Equipment Recommendations  Rolling walker with 5" wheels (3-in-1)    Recommendations for Other Services OT consult   Frequency 7X/week    Precautions / Restrictions Precautions Precautions: Fall;Knee Precaution Booklet Issued: No Required Braces or Orthoses: Knee Immobilizer - Left Knee Immobilizer - Left: Discontinue once straight leg raise with < 10 degree lag Restrictions Weight Bearing Restrictions: Yes LLE Weight Bearing: Weight bearing as tolerated   Pertinent Vitals/Pain Pt indicates 8/10 L knee.  Pt using PCA.      Mobility  Bed Mobility Bed Mobility: Supine to Sit;Sitting - Scoot to Edge of Bed Supine to Sit: 4: Min assist;With rails Sitting - Scoot to [place] of Bed: 4: Min assist Details for Bed Mobility Assistance: A with L LE only Transfers Transfers: Sit to Stand;Stand to Sit Sit to Stand: 4: Min assist;With upper extremity assist;From bed Stand to Sit: 4: Min guard;With upper extremity assist;To chair/3-in-1;With armrests Details for Transfer Assistance: cues for UE use, positioning of LEs, controlling descent to chair.   Ambulation/Gait Ambulation/Gait Assistance: 4: Min guard Ambulation Distance (Feet): 30 Feet Assistive device: Rolling walker Ambulation/Gait Assistance Details: cues for gait sequencing, upright posture, positioning in RW.   Gait Pattern: Step-to  pattern;Decreased step length - right;Decreased stance time - left;Decreased stride length;Trunk flexed Stairs: No Wheelchair Mobility Wheelchair Mobility: No    Shoulder Instructions     Exercises Total Joint Exercises Ankle Circles/Pumps: AROM;Both;10 reps Quad Sets: AROM;Both;10 reps Long Arc Quad: AAROM;Left;10 reps Knee Flexion: AAROM;Left;10 reps   PT Diagnosis: Abnormality of gait;Acute pain  PT Problem List: Decreased strength;Decreased range of motion;Decreased activity tolerance;Decreased balance;Decreased mobility;Decreased knowledge of use of DME;Pain PT Treatment Interventions: DME instruction;Gait training;Stair training;Functional mobility training;Therapeutic activities;Therapeutic exercise;Balance training;Patient/family education   PT Goals Acute Rehab PT Goals PT Goal Formulation: With patient Time For Goal Achievement: 01/28/13 Potential to Achieve Goals: Good Pt will go Supine/Side to Sit: with modified independence PT Goal: Supine/Side to Sit - Progress: Goal set today Pt will go Sit to Supine/Side: with modified independence PT Goal: Sit to Supine/Side - Progress: Goal set today Pt will go Sit to Stand: with modified independence PT Goal: Sit to Stand - Progress: Goal set today Pt will go Stand to Sit: with modified independence PT Goal: Stand to Sit - Progress: Goal set today Pt will Ambulate: >150 feet;with modified independence;with rolling walker PT Goal: Ambulate - Progress: Goal set today Pt will Go Up / Down Stairs: 1-2 stairs;with min assist;with rolling walker PT Goal: Up/Down Stairs - Progress: Goal set today Pt will Perform Home Exercise Program: with supervision, verbal cues required/provided PT Goal: Perform Home Exercise Program - Progress: Goal set today  Visit Information  Last PT Received On: 01/21/13 Assistance Needed: +1    Subjective Data  Subjective: I have my task master here to help me.  -pt indicating daughter who is a PRN PT  at  [employer].   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   Communication Communication: No difficulties    Cognition  Overall Cognitive Status: Appears within functional limits for tasks assessed/performed Arousal/Alertness: Awake/alert Orientation Level: Appears intact for tasks assessed Behavior During Session: [facility] for tasks performed    Extremity/Trunk Assessment Right Lower Extremity Assessment RLE ROM/Strength/Tone: [facility] for tasks assessed RLE Sensation: WFL - Light Touch Left Lower Extremity Assessment LLE ROM/Strength/Tone: Deficits LLE ROM/Strength/Tone Deficits: AAROM ~10-65 LLE Sensation: WFL - Light Touch Trunk Assessment Trunk Assessment: Normal   Balance Balance Balance Assessed: No  End of Session PT - End of Session Equipment Utilized During Treatment: Gait belt;Left knee immobilizer Activity Tolerance: Patient tolerated treatment well Patient left: in chair;with call bell/phone within reach;with family/visitor present Nurse Communication: Mobility status CPM Left Knee CPM Left Knee: Off  GP     [name], [place] [phone] 01/21/2013, 12:47 PM

## 2013-01-21 NOTE — Progress Notes (Signed)
Physical Therapy Treatment Patient Details Name: Kristine Horn MRN: [id number] DOB: April 21, 1941 Today's Date: 01/21/2013 Time: 5621-3086 PT Time Calculation (min): 36 min  PT Assessment / Plan / Recommendation Comments on Treatment Session  pt presents with L TKA.  pt moving better this pm with better pain control.  pt and husband with lots of questions about CPM use.  pt left in CPM at end of session 0-45.      Follow Up Recommendations  Home health PT;Supervision/Assistance - 24 hour     Does the patient have the potential to tolerate intense rehabilitation     Barriers to Discharge None      Equipment Recommendations  Rolling walker with 5" wheels    Recommendations for Other Services OT consult  Frequency 7X/week   Plan Discharge plan remains appropriate;Frequency remains appropriate    Precautions / Restrictions Precautions Precautions: Fall;Knee Precaution Booklet Issued: No Required Braces or Orthoses: Knee Immobilizer - Left Knee Immobilizer - Left: Discontinue once straight leg raise with < 10 degree lag Restrictions Weight Bearing Restrictions: Yes LLE Weight Bearing: Weight bearing as tolerated   Pertinent Vitals/Pain Indicates pain better this pm taking PO pain meds.      Mobility  Bed Mobility Bed Mobility: Supine to Sit;Sitting - Scoot to Edge of Bed;Sit to Supine Supine to Sit: 4: Min assist;With rails Sitting - Scoot to [place] of Bed: 4: Min assist Sit to Supine: 4: Min assist Details for Bed Mobility Assistance: A with L LE only Transfers Transfers: Sit to Stand;Stand to Sit Sit to Stand: 4: Min guard;With upper extremity assist;From bed Stand to Sit: 4: Min guard;With upper extremity assist;To bed Details for Transfer Assistance: cues for UE use, positioning LEs Ambulation/Gait Ambulation/Gait Assistance: 4: Min guard Ambulation Distance (Feet): 70 Feet Assistive device: Rolling walker Ambulation/Gait Assistance Details: cues for gait sequencing,  upright posture Gait Pattern: Step-to pattern;Decreased step length - right;Decreased stance time - left;Decreased stride length;Trunk flexed Stairs: No Wheelchair Mobility Wheelchair Mobility: No    Exercises Total Joint Exercises Ankle Circles/Pumps: AROM;Both;10 reps Quad Sets: AROM;Both;10 reps Long Arc Quad: AAROM;Left;10 reps Knee Flexion: AAROM;Left;10 reps   PT Diagnosis: Abnormality of gait;Acute pain  PT Problem List: Decreased strength;Decreased range of motion;Decreased activity tolerance;Decreased balance;Decreased mobility;Decreased knowledge of use of DME;Pain PT Treatment Interventions: DME instruction;Gait training;Stair training;Functional mobility training;Therapeutic activities;Therapeutic exercise;Balance training;Patient/family education   PT Goals Acute Rehab PT Goals PT Goal Formulation: With patient Time For Goal Achievement: 01/28/13 Potential to Achieve Goals: Good Pt will go Supine/Side to Sit: with modified independence PT Goal: Supine/Side to Sit - Progress: Progressing toward goal Pt will go Sit to Supine/Side: with modified independence PT Goal: Sit to Supine/Side - Progress: Progressing toward goal Pt will go Sit to Stand: with modified independence PT Goal: Sit to Stand - Progress: Progressing toward goal Pt will go Stand to Sit: with modified independence PT Goal: Stand to Sit - Progress: Progressing toward goal Pt will Ambulate: >150 feet;with modified independence;with rolling walker PT Goal: Ambulate - Progress: Progressing toward goal Pt will Go Up / Down Stairs: 1-2 stairs;with min assist;with rolling walker PT Goal: Up/Down Stairs - Progress: Goal set today Pt will Perform Home Exercise Program: with supervision, verbal cues required/provided PT Goal: Perform Home Exercise Program - Progress: Goal set today  Visit Information  Last PT Received On: 01/21/13 Assistance Needed: +1    Subjective Data  Subjective: I'm getting sleepy this  afternoon.   Patient Stated Goal: Home   Cognition  Overall  Cognitive Status: Appears within functional limits for tasks assessed/performed Arousal/Alertness: Awake/alert Orientation Level: Appears intact for tasks assessed Behavior During Session: [facility] for tasks performed    Balance  Balance Balance Assessed: No  End of Session PT - End of Session Equipment Utilized During Treatment: Gait belt;Left knee immobilizer Activity Tolerance: Patient tolerated treatment well Patient left: in bed;in CPM;with call bell/phone within reach;with family/visitor present Nurse Communication: Mobility status CPM Left Knee CPM Left Knee: Off   GP     [name], [place] [phone] 01/21/2013, 3:02 PM

## 2013-01-21 NOTE — Progress Notes (Signed)
Subjective: Pt stable - vss - pain controlled2   Objective: Vital signs in last 24 hours: Temp:  [97.6 F (36.4 C)-98.5 F (36.9 C)] 98.1 F (36.7 C) (01/22 0605) Pulse Rate:  [57-75] 58  (01/22 0605) Resp:  [8-18] 16  (01/22 0742) BP: (96-128)/(42-61) 112/42 mmHg (01/22 0605) SpO2:  [98 %-100 %] 98 % (01/22 0742) FiO2 (%):  [98 %] 98 % (01/21 1806) Weight:  [82.056 kg (180 lb 14.4 oz)] 82.056 kg (180 lb 14.4 oz) (01/21 1500)  Intake/Output from previous day: 01/21 0701 - 01/22 0700 In: 2090 [P.O.:240; I.V.:1850] Out: 1475 [Urine:1200; Drains:275] Intake/Output this shift:   2Exam:  Sensation intact distally Intact pulses distally Dorsiflexion/Plantar flexion intact  Labs:  Basename 01/21/13 0705  HGB 9.0*    Basename 01/21/13 0705  WBC 7.4  RBC 2.62*  HCT 26.6*  PLT 191    Basename 01/21/13 0705  NA 135  K 4.0  CL 101  CO2 24  BUN 13  CREATININE 0.94  GLUCOSE 109*  CALCIUM 8.6    Basename 01/21/13 0705 01/20/13 1528  LABPT -- --  INR 1.20 1.00    Assessment/Plan: Dc pca - dc foley - CPM and PT -    Kristine Horn SCOTT 01/21/2013, 11:38 AM

## 2013-01-21 NOTE — Progress Notes (Signed)
ANTICOAGULATION CONSULT NOTE   Pharmacy Consult for Coumadin Indication: VTE prophylaxis  Allergies  Allergen Reactions  . Indocin (Indomethacin) Other (See Comments)    Long acting causes migraines.   . Percodan (Oxycodone-Aspirin) Nausea And Vomiting    Can take percocet   Labs:  [facility] 01/21/13 0705 01/20/13 1528  HGB 9.0* --  HCT 26.6* --  PLT 191 --  APTT -- --  LABPROT 15.0 13.1  INR 1.20 1.00  HEPARINUNFRC -- --  CREATININE 0.94 --  CKTOTAL -- --  CKMB -- --  TROPONINI -- --    Estimated Creatinine Clearance: 61.4 ml/min (by C-G formula based on Cr of 0.94).   Assessment: 72 y/o female to begin Coumadin s/p L TKA for VTE prophylaxis. INR and CBC normal at baseline.   INR = 1.20 today - trending up  Goal of Therapy:  INR 2-3   Plan:  -Repeat Coumadin 5 mg po tonight -INR daily  Thank you. Okey Regal, PharmD [phone]  -01/21/2013 10:30 AM

## 2013-01-21 NOTE — Progress Notes (Signed)
Utilization review completed. Cybele Maule, RN, BSN. 

## 2013-01-21 NOTE — Op Note (Signed)
Kristine Horn, Kristine Horn                ACCOUNT NO.:  [id number]  MEDICAL RECORD NO.:  [id number]  LOCATION:  [id number]                        FACILITY:  [facility]  PHYSICIAN:  Burnard Bunting, M.D.    DATE OF BIRTH:  [birth year]  DATE OF PROCEDURE:  01/20/2013 DATE OF DISCHARGE:                              OPERATIVE REPORT   PREOPERATIVE DIAGNOSIS:  Left knee arthritis.  POSTOPERATIVE DIAGNOSIS:  Left knee arthritis.  PROCEDURE:  Left total knee replacement.  SURGEON:  Burnard Bunting, MD  ASSISTANT:  Skip Mayer, PA  ANESTHESIA:  General endotracheal.  ESTIMATED BLOOD LOSS:  100 mL.  DRAINS:  Hemovac x1.  TOURNIQUET TIME:  One hour 36 minutes at 300 mmHg.  INDICATION:  Kristine Horn is a 72 year old patient with end-stage left knee arthritis presented for operative management of knee arthritis after explanation of risks, benefits.  COMPONENTS UTILIZED:  [employer]ste[employer]ics Triathlon knee, 4 femur, 5 tibia, 9 polyethylene insert, PCL sacrificing 32 patella.  PROCEDURE IN DETAIL:  The patient was brought to operating room, where general endotracheal anesthesia was induced.  Preop antibiotics administered.  Time-out was called.  Left leg was prescrubbed with alcohol and Betadine, which was allowed to air dry, prepped with DuraPrep solution, draped in sterile manner.  Kristine Horn was used to cover the operative field.  The leg was elevated and exsanguinated with Esmarch wrap.  Tourniquet was inflated.  Anterior approach to the knee was made.  Skin and subcutaneous tissue was sharply divided.  Median parapatellar approach was made, precisely marked with a #1 Vicryl suture.  Fat pad was partially excised.  The PCL and ACL were released. Lateral patellofemoral ligament was released.  The intramedullary alignment was then used on the tibia.  A 9 mm cut was made off the least affected lateral tibial plateau.  Lateral posterior structures were protected.  At this time, intramedullary alignment was used on  the femur and a 10 mm distal cut was made on the femur.  This was followed by sizing which was between 4 and 5 but size 4 was the best medial lateral fit.  At this time, the chamfer cuts and box cuts were made.  Tibia was keel punched with the sensor on the medial aspect of the tibial tubercle.  The patient achieved full extension, full flexion with trial components in place, had excellent stability at 0, 30, and 90 degrees of flexion.  Trial components were removed, patella cut from 24 to 13. Trial button was placed before removing the trial components and the patient had excellent patellar tracking with no thumbs technique.  All trial components were removed.  The knee joint was then thoroughly irrigated with 3 L pulsatile lavage solution.  Components were then cemented into position.  The patient achieved full extension, full flexion, and had excellent patellar tracking with no thumbs technique and good stability varus valgus stress at 0 and 30 degrees.  Tourniquet was released.  Bleeding points were encountered, controlled with electrocautery.  Hemovac drain was placed.  Incision was then closed over bolster using #1 Vicryl suture, interrupted inverted 0 Vicryl suture, 2-0 Vicryl suture, and skin staples.  Solution of Marcaine, morphine, and clonidine injected to  the knee for postop pain relief. Bulky dressing knee immobilizer were placed.  Skip Mayer assistance was required at all times during the case for retraction and limb positioning, opening, closing, excess cement removal.  Her assistance was medical necessity.     Burnard Bunting, M.D.     GSD/MEDQ  D:  01/20/2013  T:  01/21/2013  Job:  [id number]

## 2013-01-22 ENCOUNTER — Encounter (HOSPITAL_COMMUNITY): Payer: Self-pay | Admitting: General Practice

## 2013-01-22 LAB — PROTIME-INR
INR: 1.35 (ref 0.00–1.49)
Prothrombin Time: 16.4 seconds — ABNORMAL HIGH (ref 11.6–15.2)

## 2013-01-22 LAB — CBC
HCT: 23.9 % — ABNORMAL LOW (ref 36.0–46.0)
Hemoglobin: 8.2 g/dL — ABNORMAL LOW (ref 12.0–15.0)
RBC: 2.35 MIL/uL — ABNORMAL LOW (ref 3.87–5.11)
WBC: 8 10*3/uL (ref 4.0–10.5)

## 2013-01-22 MED ORDER — WARFARIN SODIUM 5 MG PO TABS
5.0000 mg | ORAL_TABLET | Freq: Every day | ORAL | Status: DC
  Administered 2013-01-22: 5 mg via ORAL
  Filled 2013-01-22 (×2): qty 1

## 2013-01-22 NOTE — Evaluation (Signed)
Occupational Therapy Evaluation Patient Details Name: Kristine Horn MRN: [id number] DOB: March 25, 1941 Today's Date: 01/22/2013 Time: 4098-1191 OT Time Calculation (min): 31 min  OT Assessment / Plan / Recommendation Clinical Impression  Pt demos decline in function with ADLs and ADL mobility safety following L knee surgery. Pt would benefit from OT services to address these impairments to restore PLOF to return home safely    OT Assessment  Patient needs continued OT Services    Follow Up Recommendations  Home health OT    Barriers to Discharge None    Equipment Recommendations  3 in 1 bedside comode;Tub/shower seat    Recommendations for Other Services    Frequency  Min 2X/week    Precautions / Restrictions Precautions Precautions: Knee;Fall Precaution Booklet Issued: No Required Braces or Orthoses: Knee Immobilizer - Left Knee Immobilizer - Left: Discontinue once straight leg raise with < 10 degree lag Restrictions Weight Bearing Restrictions: Yes LLE Weight Bearing: Weight bearing as tolerated       ADL  Eating/Feeding: Performed;Set up Where Assessed - Eating/Feeding: Chair Grooming: Performed;Wash/dry hands;Wash/dry face;Min guard;Other (comment) (cues for postioning of RW/body close to sink) Where Assessed - Grooming: Supported standing Upper Body Bathing: Simulated;Supervision/safety;Set up Where Assessed - Upper Body Bathing: Unsupported sitting Lower Body Bathing: Simulated;Moderate assistance Where Assessed - Lower Body Bathing: Unsupported sitting;Supported sit to stand Upper Body Dressing: Performed;Supervision/safety;Set up Where Assessed - Upper Body Dressing: Unsupported sitting Lower Body Dressing: Performed;Moderate assistance Where Assessed - Lower Body Dressing: Unsupported sitting;Supported sit to stand Toilet Transfer: Minimal assistance;Other (comment) (cues for correct hand placement) Toilet Transfer Method: Other (comment) (ambulating from RW  level) Toilet Transfer Equipment: Raised toilet seat with arms (or 3-in-1 over toilet);Grab bars Toileting - Clothing Manipulation and Hygiene: Moderate assistance Where Assessed - Toileting Clothing Manipulation and Hygiene: Standing Transfers/Ambulation Related to ADLs: Required cues for correct hand placement, walker/body positioning during standing tasks ADL Comments: Pt and spouse educated on ADL A/E and DME for use at home    OT Diagnosis: Generalized weakness  OT Problem List: Decreased strength;Decreased activity tolerance;Decreased knowledge of use of DME or AE;Impaired balance (sitting and/or standing);Decreased knowledge of precautions;Pain OT Treatment Interventions: Self-care/ADL training;Therapeutic activities;Therapeutic exercise;Neuromuscular education;DME and/or AE instruction;Patient/family education;Balance training   OT Goals Acute Rehab OT Goals OT Goal Formulation: With patient/family Time For Goal Achievement: 01/22/13 Potential to Achieve Goals: Good ADL Goals Pt Will Perform Grooming: with supervision;Standing at sink;Supported ADL Goal: Grooming - Progress: Goal set today Pt Will Perform Lower Body Bathing: with min assist;with adaptive equipment ADL Goal: Lower Body Bathing - Progress: Goal set today Pt Will Perform Lower Body Dressing: with min assist;with adaptive equipment ADL Goal: Lower Body Dressing - Progress: Goal set today Pt Will Transfer to Toilet: with supervision;with DME;Grab bars ADL Goal: Toilet Transfer - Progress: Goal set today Pt Will Perform Toileting - Clothing Manipulation: with min assist;Standing ADL Goal: Toileting - Clothing Manipulation - Progress: Goal set today Pt Will Perform Toileting - Hygiene: with supervision;Sitting on 3-in-1 or toilet;Standing at 3-in-1/toilet ADL Goal: Toileting - Hygiene - Progress: Goal set today Pt Will Perform Tub/Shower Transfer: with supervision;with DME;Grab bars ADL Goal: Tub/Shower Transfer -  Progress: Goal set today  Visit Information  Last OT Received On: 01/22/13 Assistance Needed: +1    Subjective Data  Subjective: " I don't know whe  they will let me go home " Patient Stated Goal: To return home   Prior Functioning     Home Living Lives With: Spouse Available Help  at Discharge: Family;Available 24 hours/day Type of Home: House Home Access: Stairs to enter [employer] of Steps: 2 Entrance Stairs-Rails: None Home Layout: One level Bathroom Shower/Tub: Health visitor: Standard Home Adaptive Equipment: None Prior Function Level of Independence: Independent Able to Take Stairs?: Yes Driving: Yes Vocation: Retired 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 Dominant Hand: Right         Vision/Perception Vision - Assessment Eye Alignment: Within Chemical engineer Perception: Within Functional Limits   Cognition  Overall Cognitive Status: Appears within functional limits for tasks assessed/performed Arousal/Alertness: Awake/alert Orientation Level: Appears intact for tasks assessed Behavior During Session: [facility] for tasks performed    Extremity/Trunk Assessment Right Upper Extremity Assessment RUE ROM/Strength/Tone: [facility] for tasks assessed Left Upper Extremity Assessment LUE ROM/Strength/Tone: WFL for tasks assessed     Mobility Bed Mobility Bed Mobility: Not assessed Transfers Transfers: Sit to Stand;Stand to Sit Sit to Stand: 4: Min guard;With armrests;With upper extremity assist Stand to Sit: 4: Min guard;With armrests;Without upper extremity assist Details for Transfer Assistance: cues for UE use and controlling descent to chair.       Shoulder Instructions     Exercise Total Joint Exercises Ankle Circles/Pumps: AROM;Both;10 reps Quad Sets: AROM;Both;10 reps Long Arc Quad: AAROM;Left;10 reps Knee Flexion: AAROM;Left;10 reps Goniometric ROM: AAROM ~10 - 65   Balance  Balance Balance Assessed: No   End of Session OT - End of Session Equipment Utilized During Treatment: Gait belt;Left knee immobilizer;Other (comment) (RW, 3 in 1, ADL A/E) Activity Tolerance: Patient tolerated treatment well Patient left: in chair;with call bell/phone within reach;with family/visitor present  GO     Galen Manila 01/22/2013, 12:20 PM

## 2013-01-22 NOTE — Progress Notes (Signed)
Subjective: Pt stable - comfortable   Objective: Vital signs in last 24 hours: Temp:  [98 F (36.7 C)-100.5 F (38.1 C)] 98.5 F (36.9 C) (01/23 0548) Pulse Rate:  [70-79] 70  (01/23 0548) Resp:  [16] 16  (01/23 0548) BP: (122-146)/(53-60) 130/60 mmHg (01/23 0548) SpO2:  [96 %-100 %] 98 % (01/23 0548)  Intake/Output from previous day: 01/22 0701 - 01/23 0700 In: 1320 [P.O.:1320] Out: -  Intake/Output this shift:    Exam: Incision ok Dressing changed   Labs:  [facility] 01/22/13 0715 01/21/13 0705  HGB 8.2* 9.0*    Basename 01/22/13 0715 01/21/13 0705  WBC 8.0 7.4  RBC 2.35* 2.62*  HCT 23.9* 26.6*  PLT 169 191    Basename 01/21/13 0705  NA 135  K 4.0  CL 101  CO2 24  BUN 13  CREATININE 0.94  GLUCOSE 109*  CALCIUM 8.6    Basename 01/22/13 0715 01/21/13 0705  LABPT -- --  INR 1.35 1.20    Assessment/Plan: Pt doing well - increase cpm - PT - possible dc fri or sat - has husband at home   [facility] SCOTT 01/22/2013, 8:19 AM

## 2013-01-22 NOTE — Progress Notes (Signed)
Physical Therapy Treatment Patient Details Name: Kristine Horn MRN: [id number] DOB: 1941-05-23 Today's Date: 01/22/2013 Time: 4098-1191 PT Time Calculation (min): 38 min  PT Assessment / Plan / Recommendation Comments on Treatment Session  pt rpesents with L TKA.  pt moving well today, cognition a little foggy today.  pt/daughter note plan is for home tomorrow.      Follow Up Recommendations  Home health PT;Supervision/Assistance - 24 hour     Does the patient have the potential to tolerate intense rehabilitation     Barriers to Discharge        Equipment Recommendations  Rolling walker with 5" wheels    Recommendations for Other Services    Frequency 7X/week   Plan Discharge plan remains appropriate;Frequency remains appropriate    Precautions / Restrictions Precautions Precautions: Fall;Knee Precaution Booklet Issued: No Required Braces or Orthoses: Knee Immobilizer - Left Knee Immobilizer - Left: Discontinue once straight leg raise with < 10 degree lag Restrictions Weight Bearing Restrictions: Yes LLE Weight Bearing: Weight bearing as tolerated   Pertinent Vitals/Pain Indicates pain with ROM, RN made aware.    Mobility  Bed Mobility Bed Mobility: Not assessed Transfers Transfers: Sit to Stand;Stand to Sit Sit to Stand: 4: Min guard;With upper extremity assist;From chair/3-in-1;With armrests Stand to Sit: 4: Min guard;With upper extremity assist;To chair/3-in-1;With armrests Details for Transfer Assistance: cues for UE use and controlling descent to chair.   Ambulation/Gait Ambulation/Gait Assistance: 4: Min guard Ambulation Distance (Feet): 60 Feet (x2) Assistive device: Rolling walker Ambulation/Gait Assistance Details: cues for gait sequencing, increased heel strike.   Gait Pattern: Step-to pattern;Decreased step length - right;Decreased stance time - left;Decreased stride length;Trunk flexed Stairs: Yes Stairs Assistance: 4: Min assist Stairs Assistance  Details (indicate cue type and reason): cues for safe technique.   Stair Management Technique: No rails;Backwards;Forwards;With walker Number of Stairs: 1  (x2) Wheelchair Mobility Wheelchair Mobility: No    Exercises Total Joint Exercises Ankle Circles/Pumps: AROM;Both;10 reps Quad Sets: AROM;Both;10 reps Long Arc Quad: AAROM;Left;10 reps Knee Flexion: AAROM;Left;10 reps Goniometric ROM: AAROM ~10 - 65   PT Diagnosis:    PT Problem List:   PT Treatment Interventions:     PT Goals Acute Rehab PT Goals Time For Goal Achievement: 01/28/13 Potential to Achieve Goals: Good PT Goal: Sit to Stand - Progress: Progressing toward goal PT Goal: Stand to Sit - Progress: Progressing toward goal PT Goal: Ambulate - Progress: Progressing toward goal PT Goal: Up/Down Stairs - Progress: Partly met PT Goal: Perform Home Exercise Program - Progress: Progressing toward goal  Visit Information  Last PT Received On: 01/22/13 Assistance Needed: +1    Subjective Data  Subjective: I'm just a little foggy.     Cognition  Overall Cognitive Status: Appears within functional limits for tasks assessed/performed Arousal/Alertness: Awake/alert Orientation Level: Appears intact for tasks assessed Behavior During Session: [name] for tasks performed    Balance  Balance Balance Assessed: No  End of Session PT - End of Session Equipment Utilized During Treatment: Gait belt;Left knee immobilizer Activity Tolerance: Patient tolerated treatment well Patient left: in chair;with call bell/phone within reach;with family/visitor present Nurse Communication: Mobility status   GP     [name], [place] [phone] 01/22/2013, 10:45 AM

## 2013-01-22 NOTE — Progress Notes (Signed)
Physical Therapy Treatment Patient Details Name: Kristine Horn MRN: [id number] DOB: 1941/05/31 Today's Date: 01/22/2013 Time: 4742-5956 PT Time Calculation (min): 24 min  PT Assessment / Plan / Recommendation Comments on Treatment Session  pt rpesents with L TKA.  pt seems more painful this pm and continues to be a little foggy headed.      Follow Up Recommendations  Home health PT;Supervision/Assistance - 24 hour     Does the patient have the potential to tolerate intense rehabilitation     Barriers to Discharge        Equipment Recommendations  Rolling walker with 5" wheels    Recommendations for Other Services    Frequency 7X/week   Plan Discharge plan remains appropriate;Frequency remains appropriate    Precautions / Restrictions Precautions Precautions: Fall;Knee Precaution Booklet Issued: No Required Braces or Orthoses: Knee Immobilizer - Left Knee Immobilizer - Left: Discontinue once straight leg raise with < 10 degree lag Restrictions Weight Bearing Restrictions: Yes LLE Weight Bearing: Weight bearing as tolerated   Pertinent Vitals/Pain Did not rate, but indicates increased pain with ther ex.      Mobility  Bed Mobility Bed Mobility: Sit to Supine Sit to Supine: 4: Min assist Details for Bed Mobility Assistance: A with L LE only Transfers Transfers: Sit to Stand;Stand to Sit Sit to Stand: 4: Min guard;With upper extremity assist;From chair/3-in-1;With armrests Stand to Sit: 4: Min guard;With upper extremity assist;To chair/3-in-1;With armrests;To bed Details for Transfer Assistance: cues to get closer prior to sitting and use UEs.   Ambulation/Gait Ambulation/Gait Assistance: 4: Min guard Ambulation Distance (Feet): 10 Feet (and 15) Assistive device: Rolling walker Ambulation/Gait Assistance Details: cues for gait sequencing, upright posture Gait Pattern: Step-to pattern;Decreased step length - right;Decreased stance time - left;Decreased stride length;Trunk  flexed Stairs: No Wheelchair Mobility Wheelchair Mobility: No    Exercises Total Joint Exercises Ankle Circles/Pumps: AROM;Both;10 reps Quad Sets: AROM;Both;10 reps Short Arc Quad: AROM;Left;10 reps Heel Slides: AAROM;Left;10 reps Hip ABduction/ADduction: AAROM;Left;10 reps Straight Leg Raises: AAROM;Left;10 reps   PT Diagnosis:    PT Problem List:   PT Treatment Interventions:     PT Goals Acute Rehab PT Goals Time For Goal Achievement: 01/28/13 Potential to Achieve Goals: Good PT Goal: Sit to Supine/Side - Progress: Progressing toward goal PT Goal: Sit to Stand - Progress: Progressing toward goal PT Goal: Stand to Sit - Progress: Progressing toward goal PT Goal: Ambulate - Progress: Progressing toward goal PT Goal: Perform Home Exercise Program - Progress: Progressing toward goal  Visit Information  Last PT Received On: 01/22/13 Assistance Needed: +1    Subjective Data  Subjective: I feel really tired.     Cognition  Overall Cognitive Status: Appears within functional limits for tasks assessed/performed Arousal/Alertness: Awake/alert Orientation Level: Appears intact for tasks assessed Behavior During Session: [facility] for tasks performed    Balance  Balance Balance Assessed: No  End of Session PT - End of Session Equipment Utilized During Treatment: Gait belt;Left knee immobilizer Activity Tolerance: Patient tolerated treatment well Patient left: in bed;with call bell/phone within reach;with family/visitor present Nurse Communication: Mobility status   GP     [name], [place] [phone] 01/22/2013, 2:38 PM

## 2013-01-22 NOTE — Plan of Care (Signed)
Problem: Phase II Progression Outcomes Goal: Discharge plan established Recommend HH OT for ADLs and ADL mobility trg after acute d/c. Recommend 3 in 1 and shower chair for home use

## 2013-01-22 NOTE — Progress Notes (Signed)
ANTICOAGULATION CONSULT NOTE   Pharmacy Consult for Coumadin Indication: VTE prophylaxis  Allergies  Allergen Reactions  . Indocin (Indomethacin) Other (See Comments)    Long acting causes migraines.   . Percodan (Oxycodone-Aspirin) Nausea And Vomiting    Can take percocet   Labs:  Basename 01/22/13 0715 01/21/13 0705 01/20/13 1528  HGB 8.2* 9.0* --  HCT 23.9* 26.6* --  PLT 169 191 --  APTT -- -- --  LABPROT 16.4* 15.0 13.1  INR 1.35 1.20 1.00  HEPARINUNFRC -- -- --  CREATININE -- 0.94 --  CKTOTAL -- -- --  CKMB -- -- --  TROPONINI -- -- --    Estimated Creatinine Clearance: 61.4 ml/min (by C-G formula based on Cr of 0.94).   Assessment: 72 y/o female to begin Coumadin s/p L TKA for VTE prophylaxis. INR and CBC normal at baseline.   INR = 1.35 today - trending up  Goal of Therapy:  INR 2-3   Plan:  -Coumadin 5 mg po daily at 1800 pm -INR daily  Thank you. Okey Regal, PharmD [phone]  -01/22/2013 10:43 AM

## 2013-01-23 ENCOUNTER — Encounter (HOSPITAL_COMMUNITY): Payer: Self-pay | Admitting: Orthopedic Surgery

## 2013-01-23 LAB — CBC
HCT: 24.1 % — ABNORMAL LOW (ref 36.0–46.0)
Hemoglobin: 8.3 g/dL — ABNORMAL LOW (ref 12.0–15.0)
MCHC: 34.4 g/dL (ref 30.0–36.0)
RDW: 12.4 % (ref 11.5–15.5)
WBC: 7 10*3/uL (ref 4.0–10.5)

## 2013-01-23 LAB — PROTIME-INR
INR: 1.44 (ref 0.00–1.49)
Prothrombin Time: 17.2 seconds — ABNORMAL HIGH (ref 11.6–15.2)

## 2013-01-23 MED ORDER — WARFARIN SODIUM 5 MG PO TABS: 5.0000 mg | ORAL_TABLET | Freq: Every day | ORAL | Status: DC

## 2013-01-23 MED ORDER — HYDROCODONE-ACETAMINOPHEN 5-325 MG PO TABS: 1.0000 | ORAL_TABLET | Freq: Four times a day (QID) | ORAL | Status: DC | PRN

## 2013-01-23 MED ORDER — METHOCARBAMOL 500 MG PO TABS: 500.0000 mg | ORAL_TABLET | Freq: Three times a day (TID) | ORAL | Status: DC

## 2013-01-23 NOTE — Progress Notes (Signed)
Patient discharged in stable condition via wheelchair. Discharge instructions and prescriptions were given and explained 

## 2013-01-23 NOTE — Progress Notes (Signed)
PT is recommending home with HH and not SNF.  Clinical Social Worker will sign off for now as social work intervention is no longer needed. Please consult us again if new need arises.   Axavier Pressley, MSW [phone] 

## 2013-01-23 NOTE — Discharge Summary (Signed)
Physician Discharge Summary  Patient ID: Kristine Horn MRN: [id number] DOB/AGE: 1941-05-11 72 y.o.  Admit date: 01/20/2013 Discharge date: 01/23/2013  Admission Diagnoses:  Knee arthritis  Discharge Diagnoses:  Same  Surgeries: Procedure(s): TOTAL KNEE ARTHROPLASTY on 01/20/2013   Consultants:    Discharged Condition: Stable  Hospital Course: Yamile Roedl is an 72 y.o. female who was admitted 01/20/2013 with a chief complaint of left knee pain, and found to have a diagnosis of knee arthritis.  They were brought to the operating room on 01/20/2013 and underwent the above named procedures.  She tolerated the procedure well and mobilized well with PT after surgery.  Antibiotics given:  Anti-infectives     Start     Dose/Rate Route Frequency Ordered Stop   01/20/13 1600   ceFAZolin (ANCEF) IVPB 1 g/50 mL premix        1 g 100 mL/hr over 30 Minutes Intravenous 3 times per day 01/20/13 1515 01/20/13 2249   01/19/13 1422   ceFAZolin (ANCEF) IVPB 2 g/50 mL premix        2 g 100 mL/hr over 30 Minutes Intravenous 60 min pre-op 01/19/13 1422 01/20/13 0738        .  Recent vital signs:  Filed Vitals:   01/23/13 1008  BP: 124/43  Pulse: 78  Temp: 98 F (36.7 C)  Resp: 18    Recent laboratory studies:  Results for orders placed during the hospital encounter of 01/20/13  PROTIME-INR      Component Value Range   Prothrombin Time 13.1  11.6 - 15.2 seconds   INR 1.00  0.00 - 1.49  PROTIME-INR      Component Value Range   Prothrombin Time 15.0  11.6 - 15.2 seconds   INR 1.20  0.00 - 1.49  CBC      Component Value Range   WBC 7.4  4.0 - 10.5 K/uL   RBC 2.62 (*) 3.87 - 5.11 MIL/uL   Hemoglobin 9.0 (*) 12.0 - 15.0 g/dL   HCT 62.1 (*) 30.8 - 65.7 %   MCV 101.5 (*) 78.0 - 100.0 fL   MCH 34.4 (*) 26.0 - 34.0 pg   MCHC 33.8  30.0 - 36.0 g/dL   RDW 84.6  96.2 - 95.2 %   Platelets 191  150 - 400 K/uL  BASIC METABOLIC PANEL      Component Value Range   Sodium 135  135 - 145  mEq/L   Potassium 4.0  3.5 - 5.1 mEq/L   Chloride 101  96 - 112 mEq/L   CO2 24  19 - 32 mEq/L   Glucose, Bld 109 (*) 70 - 99 mg/dL   BUN 13  6 - 23 mg/dL   Creatinine, Ser 8.41  0.50 - 1.10 mg/dL   Calcium 8.6  8.4 - 32.4 mg/dL   GFR calc non Af Amer 60 (*) >90 mL/min   GFR calc Af Amer 70 (*) >90 mL/min  PROTIME-INR      Component Value Range   Prothrombin Time 16.4 (*) 11.6 - 15.2 seconds   INR 1.35  0.00 - 1.49  CBC      Component Value Range   WBC 8.0  4.0 - 10.5 K/uL   RBC 2.35 (*) 3.87 - 5.11 MIL/uL   Hemoglobin 8.2 (*) 12.0 - 15.0 g/dL   HCT 40.1 (*) 02.7 - 25.3 %   MCV 101.7 (*) 78.0 - 100.0 fL   MCH 34.9 (*) 26.0 - 34.0 pg   MCHC  34.3  30.0 - 36.0 g/dL   RDW 29.5  28.4 - 13.2 %   Platelets 169  150 - 400 K/uL  PROTIME-INR      Component Value Range   Prothrombin Time 17.2 (*) 11.6 - 15.2 seconds   INR 1.44  0.00 - 1.49  CBC      Component Value Range   WBC 7.0  4.0 - 10.5 K/uL   RBC 2.39 (*) 3.87 - 5.11 MIL/uL   Hemoglobin 8.3 (*) 12.0 - 15.0 g/dL   HCT 44.0 (*) 10.2 - 72.5 %   MCV 100.8 (*) 78.0 - 100.0 fL   MCH 34.7 (*) 26.0 - 34.0 pg   MCHC 34.4  30.0 - 36.0 g/dL   RDW 36.6  44.0 - 34.7 %   Platelets 177  150 - 400 K/uL    Discharge Medications:     Medication List     As of 01/23/2013 12:29 PM    STOP taking these medications         ibuprofen 200 MG tablet   Commonly known as: ADVIL,MOTRIN      vitamin E 400 UNIT capsule      TAKE these medications         CALCIUM 1200 PO   Take 1,200 mg elemental calcium/kg/hr by mouth 2 (two) times daily.      HYDROcodone-acetaminophen 5-325 MG per tablet   Commonly known as: NORCO/VICODIN   Take 1-2 tablets by mouth every 6 (six) hours as needed for pain.      hydroxychloroquine 200 MG tablet   Commonly known as: PLAQUENIL   Take 200 mg by mouth 2 (two) times daily.      methocarbamol 500 MG tablet   Commonly known as: ROBAXIN   Take 1 tablet (500 mg total) by mouth 3 (three) times daily.       methotrexate 2.5 MG tablet   Commonly known as: RHEUMATREX   Take 15 mg by mouth once a week. sundays      multivitamin with minerals Tabs   Take 1 tablet by mouth 2 (two) times daily.      omeprazole 40 MG capsule   Commonly known as: PRILOSEC   Take 40 mg by mouth as needed.      simvastatin 20 MG tablet   Commonly known as: ZOCOR   Take 20 mg by mouth every evening.      triamterene-hydrochlorothiazide 37.5-25 MG per tablet   Commonly known as: MAXZIDE-25   Take 1 tablet by mouth daily.      verapamil 240 MG CR tablet   Commonly known as: CALAN-SR   Take 240 mg by mouth daily.      warfarin 5 MG tablet   Commonly known as: COUMADIN   Take 1 tablet (5 mg total) by mouth daily.        Diagnostic Studies: Dg Chest 2 View  01/09/2013  *RADIOLOGY REPORT*  Clinical Data: History of pulmonary granulomatosis.  CHEST - 2 VIEW  Comparison: None.  Findings: The heart, mediastinal and hilar contours are normal. There are a number of scattered, calcified granuloma bilaterally. The lungs are clear.  No masses or edema.  No consolidation.  No pneumothoraces or effusions.  Probable small focus of pleural thickening along the left heart border possibly within the the major fissure.  No acute bony changes.  IMPRESSION: Prior granulomatous disease.  No acute findings.  Probable pleural thickening possibly in the oblique fissure on the left.  This  should be confirmed with non emergent CT at some point  to exclude the unlikely possibility of a mass.   Original Report Authenticated By: Sander Radon, M.D.     Disposition: Final discharge disposition not confirmed      Discharge Orders    Future Orders Please Complete By Expires   Diet - low sodium heart healthy      Call MD / Call 911      Comments:   If you experience chest pain or shortness of breath, CALL 911 and be transported to the hospital emergency room.  If you develope a fever above 101 F, pus (white drainage) or increased  drainage or redness at the wound, or calf pain, call your surgeon's office.   Constipation Prevention      Comments:   Drink plenty of fluids.  Prune juice may be helpful.  You may use a stool softener, such as Colace (over the counter) 100 mg twice a day.  Use MiraLax (over the counter) for constipation as needed.   Increase activity slowly as tolerated      Discharge instructions      Comments:   1.Keep incision dry until Tuesday. 2.CPM machine 1 - 2 hours per 8 as tolerated 3. Hold Maxide blood pressure med until Monday then resume 4. OK to resume methotrexate and plaquinil next Friday 5. Hold vitamin e until off coumadin         Signed: Agostino Gorin SCOTT 01/23/2013, 12:29 PM

## 2013-01-23 NOTE — Progress Notes (Signed)
Subjective: Pt stable - moving well   Objective: Vital signs in last 24 hours: Temp:  [98 F (36.7 C)-98.3 F (36.8 C)] 98 F (36.7 C) (01/24 1008) Pulse Rate:  [76-80] 78  (01/24 1008) Resp:  [18] 18  (01/24 1008) BP: (113-134)/(43-50) 124/43 mmHg (01/24 1008) SpO2:  [94 %-100 %] 100 % (01/24 1008)  Intake/Output from previous day: 01/23 0701 - 01/24 0700 In: 240 [P.O.:240] Out: -  Intake/Output this shift:    Exam:  Sensation intact distally Intact pulses distally Dorsiflexion/Plantar flexion intact  Labs:  Basename 01/23/13 0610 01/22/13 0715 01/21/13 0705  HGB 8.3* 8.2* 9.0*    Basename 01/23/13 0610 01/22/13 0715  WBC 7.0 8.0  RBC 2.39* 2.35*  HCT 24.1* 23.9*  PLT 177 169    Basename 01/21/13 0705  NA 135  K 4.0  CL 101  CO2 24  BUN 13  CREATININE 0.94  GLUCOSE 109*  CALCIUM 8.6    Basename 01/23/13 0610 01/22/13 0715  LABPT -- --  INR 1.44 1.35    Assessment/Plan: Dc home after pt   Errica Dutil SCOTT 01/23/2013, 11:58 AM

## 2013-01-23 NOTE — Progress Notes (Signed)
ANTICOAGULATION CONSULT NOTE - Follow Up Consult  Pharmacy Consult for Coumadin Indication: VTE prophylaxis  Allergies  Allergen Reactions  . Indocin (Indomethacin) Other (See Comments)    Long acting causes migraines.   . Percodan (Oxycodone-Aspirin) Nausea And Vomiting    Can take percocet    Patient Measurements: Height: 5\' 7"  (170.2 cm) Weight: 180 lb 14.4 oz (82.056 kg) IBW/kg (Calculated) : 61.6  Heparin Dosing Weight:   Vital Signs: Temp: 98.3 F (36.8 C) (01/24 0603) BP: 113/43 mmHg (01/24 0603) Pulse Rate: 78  (01/24 0603)  Labs:  Basename 01/23/13 0610 01/22/13 0715 01/21/13 0705  HGB 8.3* 8.2* --  HCT 24.1* 23.9* 26.6*  PLT 177 169 191  APTT -- -- --  LABPROT 17.2* 16.4* 15.0  INR 1.44 1.35 1.20  HEPARINUNFRC -- -- --  CREATININE -- -- 0.94  CKTOTAL -- -- --  CKMB -- -- --  TROPONINI -- -- --    Estimated Creatinine Clearance: 61.4 ml/min (by C-G formula based on Cr of 0.94).     Assessment: INR is 1.44 after 5mg  coumadin x 3. Continues to rise nicely. Goal of Therapy:  INR 2-3 Monitor platelets by anticoagulation protocol: Yes   Plan:  Continue Coumadin 5 mg daily. Continue daily PT/INR  Eugene Garnet 01/23/2013,9:57 AM

## 2013-01-23 NOTE — Progress Notes (Signed)
Occupational Therapy Treatment Patient Details Name: Kristine Horn MRN: [id number] DOB: 03-06-1941 Today's Date: 01/23/2013 Time: 7829-5621 OT Time Calculation (min): 29 min  OT Assessment / Plan / Recommendation Comments on Treatment Session Pt doing well and making progress, pt should continue with OT services to maximize level of function and safety    Follow Up Recommendations  Home health OT    Barriers to Discharge       Equipment Recommendations  3 in 1 bedside comode;Tub/shower seat    Recommendations for Other Services    Frequency Min 2X/week   Plan Discharge plan remains appropriate    Precautions / Restrictions Precautions Precautions: Knee;Fall Precaution Booklet Issued: No Required Braces or Orthoses: Knee Immobilizer - Left Knee Immobilizer - Left: Discontinue once straight leg raise with < 10 degree lag Restrictions Weight Bearing Restrictions: Yes LLE Weight Bearing: Weight bearing as tolerated   Pertinent Vitals/Pain     ADL  Lower Body Bathing: Simulated;Minimal assistance Where Assessed - Lower Body Bathing: Unsupported sitting Lower Body Dressing: Performed;Minimal assistance Where Assessed - Lower Body Dressing: Unsupported sitting Toilet Transfer: Performed;Min guard Toilet Transfer Method: Other (comment) (ambulating from RW level) Toilet Transfer Equipment: Raised toilet seat with arms (or 3-in-1 over toilet) Toileting - Clothing Manipulation and Hygiene: Performed;Minimal assistance Where Assessed - Engineer, mining and Hygiene: Standing Tub/Shower Transfer: Performed;Min guard;Other (comment) (cues for safety with LE sequencing from RE level, grab bars) Tub/Shower Transfer Method: Ambulating Tub/Shower Transfer Equipment: Shower seat with back;Grab bars;Walk in shower Equipment Used: Knee Immobilizer;Gait belt;Rolling walker;Sock aid;Long-handled sponge;Reacher;Other (comment) (3 in 1) ADL Comments: Pt's dtr (prn PT) present  during tx session. pt required cues for correct sequence of LEs to transition to wlak in shower    OT Diagnosis:    OT Problem List:   OT Treatment Interventions:     OT Goals ADL Goals ADL Goal: Lower Body Bathing - Progress: Progressing toward goals ADL Goal: Lower Body Dressing - Progress: Progressing toward goals ADL Goal: Toilet Transfer - Progress: Progressing toward goals ADL Goal: Toileting - Clothing Manipulation - Progress: Progressing toward goals ADL Goal: Toileting - Hygiene - Progress: Progressing toward goals ADL Goal: Tub/Shower Transfer - Progress: Progressing toward goals  Visit Information  Last OT Received On: 01/23/13 Assistance Needed: +1    Subjective Data  Subjective: " I might get to go home today " Patient Stated Goal: To return home   Prior Functioning       Cognition  Overall Cognitive Status: Appears within functional limits for tasks assessed/performed Arousal/Alertness: Awake/alert Orientation Level: Appears intact for tasks assessed Behavior During Session: [facility] for tasks performed    Mobility  Shoulder Instructions Bed Mobility Bed Mobility: Not assessed Transfers Transfers: Sit to Stand;Stand to Sit Sit to Stand: From chair/3-in-1;With armrests;5: Supervision Stand to Sit: With armrests;5: Supervision;To chair/3-in-1 Details for Transfer Assistance: if given time to process, pt able to demo good technique.         Exercises      Balance Balance Balance Assessed: No   End of Session OT - End of Session Equipment Utilized During Treatment: Gait belt;Left knee immobilizer;Other (comment) (3 in 1, shower seat, ADL A/E) Activity Tolerance: Patient tolerated treatment well Patient left: in chair;with call bell/phone within reach;with family/visitor present  GO     Galen Manila 01/23/2013, 11:51 AM

## 2013-01-23 NOTE — Progress Notes (Signed)
Physical Therapy Treatment Patient Details Name: Kristine Horn MRN: [id number] DOB: [birth year] Today's Date: 01/23/2013 Time: 4098-1191 PT Time Calculation (min): 23 min  PT Assessment / Plan / Recommendation Comments on Treatment Session  pt presents with L TKA.  pt cognition much improved and able to recall events from yesterday.  pt movign well and is ready for D/C from PT stand point.      Follow Up Recommendations  Home health PT;Supervision/Assistance - 24 hour     Does the patient have the potential to tolerate intense rehabilitation     Barriers to Discharge        Equipment Recommendations  Rolling walker with 5" wheels (3-in-1)    Recommendations for Other Services    Frequency 7X/week   Plan Discharge plan remains appropriate;Frequency remains appropriate    Precautions / Restrictions Precautions Precautions: Fall;Knee Precaution Booklet Issued: No Required Braces or Orthoses: Knee Immobilizer - Left Knee Immobilizer - Left: Discontinue once straight leg raise with < 10 degree lag Restrictions Weight Bearing Restrictions: Yes LLE Weight Bearing: Weight bearing as tolerated   Pertinent Vitals/Pain Indicates pain better today, minimal with mobility.      Mobility  Bed Mobility Bed Mobility: Not assessed Transfers Transfers: Sit to Stand;Stand to Sit Sit to Stand: 5: Supervision;With upper extremity assist;From chair/3-in-1;With armrests Stand to Sit: 5: Supervision;With upper extremity assist;To chair/3-in-1;With armrests Details for Transfer Assistance: if given time to process, pt able to demo good technique.   Ambulation/Gait Ambulation/Gait Assistance: 5: Supervision Ambulation Distance (Feet): 120 Feet (x2) Assistive device: Rolling walker Ambulation/Gait Assistance Details: cues for increased step length on R LE with increased WBing on L LE.   Gait Pattern: Step-through pattern;Decreased step length - right;Decreased stance time - left Stairs:  Yes Stairs Assistance: 4: Min guard Stairs Assistance Details (indicate cue type and reason): cues for safe technique.   Stair Management Technique: No rails;Forwards;With walker Number of Stairs: 1  Wheelchair Mobility Wheelchair Mobility: No    Exercises     PT Diagnosis:    PT Problem List:   PT Treatment Interventions:     PT Goals Acute Rehab PT Goals Time For Goal Achievement: 01/28/13 Potential to Achieve Goals: Good PT Goal: Sit to Stand - Progress: Progressing toward goal PT Goal: Stand to Sit - Progress: Progressing toward goal PT Goal: Ambulate - Progress: Progressing toward goal PT Goal: Up/Down Stairs - Progress: Met  Visit Information  Last PT Received On: 01/23/13 Assistance Needed: +1    Subjective Data  Subjective: I feel much better today.     Cognition  Overall Cognitive Status: Appears within functional limits for tasks assessed/performed Arousal/Alertness: Awake/alert Orientation Level: Appears intact for tasks assessed Behavior During Session: [facility] for tasks performed    Balance  Balance Balance Assessed: No  End of Session PT - End of Session Equipment Utilized During Treatment: Gait belt;Left knee immobilizer Activity Tolerance: Patient tolerated treatment well Patient left: in chair;with call bell/phone within reach;with family/visitor present Nurse Communication: Mobility status   GP     [name], [place] [phone] 01/23/2013, 10:41 AM

## 2013-03-31 HISTORY — PX: VIDEO ASSISTED THORACOSCOPY (VATS)/THOROCOTOMY: SHX6173

## 2013-04-30 HISTORY — PX: OTHER SURGICAL HISTORY: SHX169

## 2013-07-09 DIAGNOSIS — Z9289 Personal history of other medical treatment: Secondary | ICD-10-CM

## 2013-07-09 HISTORY — DX: Personal history of other medical treatment: Z92.89

## 2014-04-05 ENCOUNTER — Emergency Department (HOSPITAL_BASED_OUTPATIENT_CLINIC_OR_DEPARTMENT_OTHER): Payer: Medicare Other

## 2014-04-05 ENCOUNTER — Emergency Department (HOSPITAL_BASED_OUTPATIENT_CLINIC_OR_DEPARTMENT_OTHER)
Admission: EM | Admit: 2014-04-05 | Discharge: 2014-04-05 | Disposition: A | Discharge status: Home / Self Care | Payer: Medicare Other | Attending: Emergency Medicine | Admitting: Emergency Medicine

## 2014-04-05 ENCOUNTER — Encounter (HOSPITAL_BASED_OUTPATIENT_CLINIC_OR_DEPARTMENT_OTHER): Payer: Self-pay | Admitting: Emergency Medicine

## 2014-04-05 DIAGNOSIS — Y9239 Other specified sports and athletic area as the place of occurrence of the external cause: Secondary | ICD-10-CM | POA: Insufficient documentation

## 2014-04-05 DIAGNOSIS — Z8739 Personal history of other diseases of the musculoskeletal system and connective tissue: Secondary | ICD-10-CM | POA: Insufficient documentation

## 2014-04-05 DIAGNOSIS — Z7901 Long term (current) use of anticoagulants: Secondary | ICD-10-CM | POA: Insufficient documentation

## 2014-04-05 DIAGNOSIS — I10 Essential (primary) hypertension: Secondary | ICD-10-CM | POA: Insufficient documentation

## 2014-04-05 DIAGNOSIS — Z85118 Personal history of other malignant neoplasm of bronchus and lung: Secondary | ICD-10-CM | POA: Insufficient documentation

## 2014-04-05 DIAGNOSIS — S42209A Unspecified fracture of upper end of unspecified humerus, initial encounter for closed fracture: Secondary | ICD-10-CM

## 2014-04-05 DIAGNOSIS — Z8709 Personal history of other diseases of the respiratory system: Secondary | ICD-10-CM | POA: Insufficient documentation

## 2014-04-05 DIAGNOSIS — Z87891 Personal history of nicotine dependence: Secondary | ICD-10-CM | POA: Insufficient documentation

## 2014-04-05 DIAGNOSIS — K219 Gastro-esophageal reflux disease without esophagitis: Secondary | ICD-10-CM | POA: Insufficient documentation

## 2014-04-05 DIAGNOSIS — Y92838 Other recreation area as the place of occurrence of the external cause: Secondary | ICD-10-CM

## 2014-04-05 DIAGNOSIS — Y9389 Activity, other specified: Secondary | ICD-10-CM | POA: Insufficient documentation

## 2014-04-05 DIAGNOSIS — Z79899 Other long term (current) drug therapy: Secondary | ICD-10-CM | POA: Insufficient documentation

## 2014-04-05 DIAGNOSIS — R296 Repeated falls: Secondary | ICD-10-CM | POA: Insufficient documentation

## 2014-04-05 HISTORY — DX: Malignant neoplasm of unspecified part of unspecified bronchus or lung: C34.90

## 2014-04-05 LAB — CBC
HCT: 33.3 % — ABNORMAL LOW (ref 36.0–46.0)
Hemoglobin: 11.2 g/dL — ABNORMAL LOW (ref 12.0–15.0)
MCH: 35.7 pg — ABNORMAL HIGH (ref 26.0–34.0)
MCHC: 33.6 g/dL (ref 30.0–36.0)
MCV: 106.1 fL — ABNORMAL HIGH (ref 78.0–100.0)
Platelets: 246 10*3/uL (ref 150–400)
RBC: 3.14 MIL/uL — ABNORMAL LOW (ref 3.87–5.11)
RDW: 12.6 % (ref 11.5–15.5)
WBC: 8.8 10*3/uL (ref 4.0–10.5)

## 2014-04-05 LAB — BASIC METABOLIC PANEL
BUN: 25 mg/dL — AB (ref 6–23)
CO2: 26 mEq/L (ref 19–32)
CREATININE: 1.2 mg/dL — AB (ref 0.50–1.10)
Calcium: 9.9 mg/dL (ref 8.4–10.5)
Chloride: 100 mEq/L (ref 96–112)
GFR, EST AFRICAN AMERICAN: 51 mL/min — AB (ref >90)
GFR, EST NON AFRICAN AMERICAN: 44 mL/min — AB (ref >90)
Glucose, Bld: 107 mg/dL — ABNORMAL HIGH (ref 70–99)
POTASSIUM: 3.4 meq/L — AB (ref 3.7–5.3)
Sodium: 141 mEq/L (ref 137–147)

## 2014-04-05 MED ORDER — MORPHINE SULFATE 4 MG/ML IJ SOLN
4.0000 mg | Freq: Once | INTRAMUSCULAR | Status: AC
  Administered 2014-04-05: 4 mg via INTRAVENOUS
  Filled 2014-04-05: qty 1

## 2014-04-05 MED ORDER — ONDANSETRON HCL 4 MG/2ML IJ SOLN
4.0000 mg | Freq: Once | INTRAMUSCULAR | Status: AC
  Administered 2014-04-05: 4 mg via INTRAVENOUS
  Filled 2014-04-05: qty 2

## 2014-04-05 MED ORDER — ONDANSETRON HCL 4 MG PO TABS: 4.0000 mg | ORAL_TABLET | Freq: Four times a day (QID) | ORAL | Status: DC

## 2014-04-05 MED ORDER — OXYCODONE-ACETAMINOPHEN 5-325 MG PO TABS: 1.0000 | ORAL_TABLET | Freq: Four times a day (QID) | ORAL | Status: DC | PRN

## 2014-04-05 NOTE — ED Notes (Signed)
Pt states was playing pickle ball and fell hurting right shoulder. Pt unable to raise arm.

## 2014-04-05 NOTE — ED Provider Notes (Signed)
CSN: [id number]     Arrival date & time 04/05/14  1046 History   First MD Initiated Contact with Patient 04/05/14 1153     Chief Complaint  Patient presents with  . Shoulder Pain     (Consider location/radiation/quality/duration/timing/severity/associated sxs/prior Treatment) HPI Pt presents with right shoulder pain. She states pain began acutely- she fell while playing pickle ball earlier today.  She states she fell over onto her right shoulder.  Denies striking head, no neck pain.  Pain is worse with movement and palpation.  She has not taken anything for her pain prior to arrival.  There are no other associated systemic symptoms, there are no other alleviating or modifying factors.   Past Medical History  Diagnosis Date  . GERD (gastroesophageal reflux disease)   . Hypertension   . Hyperlipidemia   . Pulmonary granulomatosis   . Paroxysmal SVT (supraventricular tachycardia)     Sees [facility]  . RHA (rheumatoid arthritis)   . Lung cancer    Past Surgical History  Procedure Laterality Date  . Tracheostomy      Age 8  . Tonsillectomy    . Cataract extraction  01/2012    left eye  . Total knee arthroplasty  01/20/2013    LEFT KNEE  . Total knee arthroplasty  01/20/2013    Procedure: TOTAL KNEE ARTHROPLASTY;  Surgeon: Meredith Pel, MD;  Location: [place];  Service: Orthopedics;  Laterality: Left;  Left total Knee Arthroplasty   No family history on file. History  Substance Use Topics  . Smoking status: Former Smoker -- 0.50 packs/day for 30 years    Types: Cigarettes    Quit date: 12/31/1977  . Smokeless tobacco: Never Used  . Alcohol Use: Yes     Comment: Drink wine 1 a month   OB History   Grav Para Term Preterm Abortions TAB SAB Ect Mult Living                 Review of Systems ROS reviewed and all otherwise negative except for mentioned in HPI    Allergies  Indocin and Percodan  Home Medications   Current Outpatient Rx  Name  Route   Sig  Dispense  Refill  . Calcium Carbonate-Vit D-Min (CALCIUM 1200 PO)   Oral   Take 1,200 mg elemental calcium/kg/hr by mouth 2 (two) times daily.         Marland Kitchen erlotinib (TARCEVA) 25 MG tablet   Oral   Take 50 mg by mouth daily. Take on an empty stomach 1 hour before meals or 2 hours after.         . hydroxychloroquine (PLAQUENIL) 200 MG tablet   Oral   Take 200 mg by mouth 2 (two) times daily.         . methotrexate (RHEUMATREX) 2.5 MG tablet   Oral   Take 15 mg by mouth once a week. sundays         . Multiple Vitamin (MULTIVITAMIN WITH MINERALS) TABS   Oral   Take 1 tablet by mouth 2 (two) times daily.         Marland Kitchen omeprazole (PRILOSEC) 40 MG capsule   Oral   Take 40 mg by mouth as needed.         . simvastatin (ZOCOR) 20 MG tablet   Oral   Take 20 mg by mouth every evening.         . triamterene-hydrochlorothiazide (MAXZIDE-25) 37.5-25 MG per tablet  Oral   Take 1 tablet by mouth daily.         . verapamil (CALAN-SR) 240 MG CR tablet   Oral   Take 240 mg by mouth daily.         Marland Kitchen HYDROcodone-acetaminophen (NORCO) 5-325 MG per tablet   Oral   Take 1-2 tablets by mouth every 6 (six) hours as needed for pain.   60 tablet   0   . methocarbamol (ROBAXIN) 500 MG tablet   Oral   Take 1 tablet (500 mg total) by mouth 3 (three) times daily.   30 tablet   0   . ondansetron (ZOFRAN) 4 MG tablet   Oral   Take 1 tablet (4 mg total) by mouth every 6 (six) hours.   12 tablet   0   . oxyCODONE-acetaminophen (PERCOCET/ROXICET) 5-325 MG per tablet   Oral   Take 1-2 tablets by mouth every 6 (six) hours as needed for severe pain.   20 tablet   0   . warfarin (COUMADIN) 5 MG tablet   Oral   Take 1 tablet (5 mg total) by mouth daily.   30 tablet   1    BP 129/49  Pulse 74  Temp(Src) 97.4 F (36.3 C) (Oral)  Resp 18  SpO2 100% Vitals reviewed Physical Exam Physical Examination: General appearance - alert, well appearing, and in no distress Mental  status - alert, oriented to person, place, and time Eyes - no conjunctival injection, no scleral icterus Mouth - mucous membranes moist, pharynx normal without lesions Neck - supple, no significant adenopathy, no midline tenderness to palpation, FROM without pain Chest - clear to auscultation, no wheezes, rales or rhonchi, symmetric air entry Heart - normal rate, regular rhythm, normal S1, S2, no murmurs, rubs, clicks or gallops Back exam - full range of motion, no tenderness, palpable spasm or pain on motion Neurological - alert, oriented, normal speech, strength 5/5 in extremities x 4, sensation intact Musculoskeletal - ttp over anterior shoulder, pain with minimal attempt at range of motion, otherwise no joint tenderness, deformity or swelling Extremities - peripheral pulses normal, no pedal edema, no clubbing or cyanosis Skin - normal coloration and turgor, no rashes, no suspicious skin lesions noted  ED Course  Procedures (including critical care time)  1:02 PM went to see patient and she was in restroom.  Husband requesting pain medication,  I have ordered this and spoken with nurse.  Nurse states patient declined pain medication on arrival stating she wanted to find out what was wrong first.  Labs Review Labs Reviewed  CBC - Abnormal; Notable for the following:    RBC 3.14 (*)    Hemoglobin 11.2 (*)    HCT 33.3 (*)    MCV 106.1 (*)    MCH 35.7 (*)    All other components within normal limits  BASIC METABOLIC PANEL - Abnormal; Notable for the following:    Potassium 3.4 (*)    Glucose, Bld 107 (*)    BUN 25 (*)    Creatinine, Ser 1.20 (*)    GFR calc non Af Amer 44 (*)    GFR calc Af Amer 51 (*)    All other components within normal limits   Imaging Review No results found.   EKG Interpretation None      MDM   Final diagnoses:  Proximal humerus fracture    Pt presenting with right shoulder pain after fall onto right side.  Proximal humerus fracture on  right side.   Pt distally NVI.  Pt treated with pain medication and placed in sling.  Given information for ortho followup.  Discharged with strict return precautions.  Pt agreeable with plan.    Threasa Beards, MD 04/10/14 [phone]

## 2014-04-05 NOTE — Discharge Instructions (Signed)
Return to the ED with any concerns including increased pain not controlled by pain medication, numbness/discoloration/swelling of your hand or fingers, or any other alarming symptoms

## 2014-04-07 ENCOUNTER — Other Ambulatory Visit: Payer: Self-pay | Admitting: Orthopedic Surgery

## 2014-04-07 ENCOUNTER — Ambulatory Visit
Admission: RE | Admit: 2014-04-07 | Discharge: 2014-04-07 | Disposition: A | Discharge status: Home / Self Care | Payer: Medicare Other | Source: Ambulatory Visit | Attending: Orthopedic Surgery | Admitting: Orthopedic Surgery

## 2014-04-07 DIAGNOSIS — T148XXA Other injury of unspecified body region, initial encounter: Secondary | ICD-10-CM

## 2014-04-14 ENCOUNTER — Encounter (HOSPITAL_COMMUNITY): Payer: Self-pay | Admitting: Pharmacy Technician

## 2014-04-14 ENCOUNTER — Other Ambulatory Visit (HOSPITAL_COMMUNITY): Payer: Self-pay | Admitting: Orthopedic Surgery

## 2014-04-16 ENCOUNTER — Other Ambulatory Visit (HOSPITAL_COMMUNITY): Payer: Self-pay | Admitting: *Deleted

## 2014-04-16 ENCOUNTER — Encounter (HOSPITAL_COMMUNITY)
Admission: RE | Admit: 2014-04-16 | Discharge: 2014-04-16 | Disposition: A | Discharge status: Home / Self Care | Payer: Medicare Other | Source: Ambulatory Visit | Attending: Orthopedic Surgery | Admitting: Orthopedic Surgery

## 2014-04-16 ENCOUNTER — Encounter (HOSPITAL_COMMUNITY): Payer: Self-pay

## 2014-04-16 DIAGNOSIS — Z01812 Encounter for preprocedural laboratory examination: Secondary | ICD-10-CM | POA: Insufficient documentation

## 2014-04-16 DIAGNOSIS — Z01810 Encounter for preprocedural cardiovascular examination: Secondary | ICD-10-CM | POA: Insufficient documentation

## 2014-04-16 DIAGNOSIS — Z01818 Encounter for other preprocedural examination: Secondary | ICD-10-CM | POA: Insufficient documentation

## 2014-04-16 HISTORY — DX: Other complications of anesthesia, initial encounter: T88.59XA

## 2014-04-16 HISTORY — DX: Personal history of other medical treatment: Z92.89

## 2014-04-16 HISTORY — DX: Nausea with vomiting, unspecified: Z98.890

## 2014-04-16 HISTORY — DX: Anemia, unspecified: D64.9

## 2014-04-16 HISTORY — DX: Adverse effect of unspecified anesthetic, initial encounter: T41.45XA

## 2014-04-16 HISTORY — DX: Phlebitis and thrombophlebitis of unspecified site: I80.9

## 2014-04-16 HISTORY — DX: Cardiac arrhythmia, unspecified: I49.9

## 2014-04-16 HISTORY — DX: Constipation, unspecified: K59.00

## 2014-04-16 HISTORY — DX: Nausea with vomiting, unspecified: R11.2

## 2014-04-16 LAB — URINALYSIS, ROUTINE W REFLEX MICROSCOPIC
BILIRUBIN URINE: NEGATIVE
GLUCOSE, UA: NEGATIVE mg/dL
Hgb urine dipstick: NEGATIVE
KETONES UR: NEGATIVE mg/dL
Nitrite: NEGATIVE
PROTEIN: NEGATIVE mg/dL
Specific Gravity, Urine: 1.015 (ref 1.005–1.030)
Urobilinogen, UA: 1 mg/dL (ref 0.0–1.0)
pH: 7 (ref 5.0–8.0)

## 2014-04-16 LAB — CBC
HCT: 30 % — ABNORMAL LOW (ref 36.0–46.0)
HEMOGLOBIN: 9.7 g/dL — AB (ref 12.0–15.0)
MCH: 34 pg (ref 26.0–34.0)
MCHC: 32.3 g/dL (ref 30.0–36.0)
MCV: 105.3 fL — ABNORMAL HIGH (ref 78.0–100.0)
Platelets: 311 10*3/uL (ref 150–400)
RBC: 2.85 MIL/uL — ABNORMAL LOW (ref 3.87–5.11)
RDW: 13.7 % (ref 11.5–15.5)
WBC: 6.8 10*3/uL (ref 4.0–10.5)

## 2014-04-16 LAB — BASIC METABOLIC PANEL
BUN: 23 mg/dL (ref 6–23)
CO2: 26 mEq/L (ref 19–32)
Calcium: 9.6 mg/dL (ref 8.4–10.5)
Chloride: 102 mEq/L (ref 96–112)
Creatinine, Ser: 1 mg/dL (ref 0.50–1.10)
GFR, EST AFRICAN AMERICAN: 64 mL/min — AB (ref >90)
GFR, EST NON AFRICAN AMERICAN: 55 mL/min — AB (ref >90)
Glucose, Bld: 84 mg/dL (ref 70–99)
Potassium: 3.4 mEq/L — ABNORMAL LOW (ref 3.7–5.3)
Sodium: 141 mEq/L (ref 137–147)

## 2014-04-16 LAB — URINE MICROSCOPIC-ADD ON

## 2014-04-16 NOTE — Pre-Procedure Instructions (Signed)
Kristine Horn  04/16/2014   Your procedure is scheduled on:  Tuesday, April 20, 2014 at 7:30 AM.   Report to [facility] Entrance "A" Admitting Office at 5:30 AM.   Call this number if you have problems the morning of surgery: [phone]   Remember:   Do not eat food or drink liquids after midnight Monday, 04/19/14.   Take these medicines the morning of surgery with A SIP OF WATER: verapamil (CALAN-SR), hydroxychloroquine (PLAQUENIL), ondansetron (ZOFRAN) - if needed, oxyCODONE-acetaminophen (PERCOCET/ROXICET) - if needed.   Stop Vitamins and Ibuprofen as of today.     Do not wear jewelry, make-up or nail polish.  Do not wear lotions, powders, or perfumes. You may NOT wear deodorant.  Do not shave 48 hours prior to surgery.   Do not bring valuables to the hospital.  [facility] is not responsible                  for any belongings or valuables.               Contacts, dentures or bridgework may not be worn into surgery.  Leave suitcase in the car. After surgery it may be brought to your room.  For patients admitted to the hospital, discharge time is determined by your                treatment team.               Special Instructions: [place] - Preparing for Surgery  Before surgery, you can play an important role.  Because skin is not sterile, your skin needs to be as free of germs as possible.  You can reduce the number of germs on you skin by washing with CHG (chlorahexidine gluconate) soap before surgery.  CHG is an antiseptic cleaner which kills germs and bonds with the skin to continue killing germs even after washing.  Please DO NOT use if you have an allergy to CHG or antibacterial soaps.  If your skin becomes reddened/irritated stop using the CHG and inform your nurse when you arrive at Short Stay.  Do not shave (including legs and underarms) for at least 48 hours prior to the first CHG shower.  You may shave your face.  Please follow these instructions  carefully:   1.  Shower with CHG Soap the night before surgery and the                                morning of Surgery.  2.  If you choose to wash your hair, wash your hair first as usual with your       normal shampoo.  3.  After you shampoo, rinse your hair and body thoroughly to remove the                      Shampoo.  4.  Use CHG as you would any other liquid soap.  You can apply chg directly       to the skin and wash gently with scrungie or a clean washcloth.  5.  Apply the CHG Soap to your body ONLY FROM THE NECK DOWN.        Do not use on open wounds or open sores.  Avoid contact with your eyes, ears, mouth and genitals (private parts).  Wash genitals (private parts) with your normal soap.  6.  Wash thoroughly,  paying special attention to the area where your surgery        will be performed.  7.  Thoroughly rinse your body with warm water from the neck down.  8.  DO NOT shower/wash with your normal soap after using and rinsing off       the CHG Soap.  9.  Pat yourself dry with a clean towel.            10.  Wear clean pajamas.            11.  Place clean sheets on your bed the night of your first shower and do not        sleep with pets.  Day of Surgery  Do not apply any lotions/deodorants the morning of surgery.  Please wear clean clothes to the hospital/surgery center.     Please read over the following fact sheets that you were given: Pain Booklet, Coughing and Deep Breathing, Blood Transfusion Information and Surgical Site Infection Prevention

## 2014-04-17 LAB — URINE CULTURE: Colony Count: 7000

## 2014-04-19 MED ORDER — CEFAZOLIN SODIUM-DEXTROSE 2-3 GM-% IV SOLR
2.0000 g | INTRAVENOUS | Status: AC
  Administered 2014-04-20 (×2): 2 g via INTRAVENOUS
  Filled 2014-04-19: qty 50

## 2014-04-19 NOTE — Progress Notes (Signed)
Anesthesia chart review: Patient is a 73 year old female scheduled for right shoulder hemi-arthroplasty on 04/20/14 by Dr. Marlou Sa. She sustained a right proximal humerus fracture following a fall while playing pickle ball on 04/05/14.  History includes former smoker, rheumatoid arthritis, hypertension, hyperlipidemia, GERD, pulmonary granulomatosis, LLL lung cancer s/p VATS 03/2013 (on Tarceva--on hold for surgery), anemia with chermotherapy, paroxysmal SVT followed by her PCP (on verapamil), emergency tracheostomy at age 59 due to choking on food, cataract extraction, left TKA 01/20/13, post-operative N/V, Port-a-cath. PCP is listed as Dr. Hayden Rasmussen. HEM-ONC is Dr. Dustin Folks with [facility].  EKG on  04/16/14 showed NSR, borderline criteria for LVH.   CXR on 04/16/14 showed: There is hyperinflation consistent with COPD. There is likely subsegmental atelectasis versus scarring in the left mid lung. There is no evidence of CHF.  Preoperative labs noted. H/H 9.7/30.0.  She has a history of anemia requiring transfusion while undergoing treatment for lung cancer in 2014. No tachycardia or hypotension with PAT vitals. T&S already done.  Defer decision for transfusion to Dr. Marlou Sa and/or her assigned anesthesiologist. Urine culture showed insignificant growth.  George [facility] Short Stay Center/Anesthesiology Phone [phone] 04/19/2014 9:43 AM

## 2014-04-20 ENCOUNTER — Inpatient Hospital Stay (HOSPITAL_COMMUNITY)
Admission: RE | Admit: 2014-04-20 | Discharge: 2014-04-22 | DRG: 483 | Disposition: A | Discharge status: Home / Self Care | Payer: Medicare Other | Source: Ambulatory Visit | Attending: Orthopedic Surgery | Admitting: Orthopedic Surgery

## 2014-04-20 ENCOUNTER — Encounter (HOSPITAL_COMMUNITY)
Admission: RE | Disposition: A | Discharge status: Home / Self Care | Payer: Self-pay | Source: Ambulatory Visit | Attending: Orthopedic Surgery

## 2014-04-20 ENCOUNTER — Inpatient Hospital Stay (HOSPITAL_COMMUNITY): Payer: Medicare Other

## 2014-04-20 ENCOUNTER — Inpatient Hospital Stay (HOSPITAL_COMMUNITY): Payer: Medicare Other | Admitting: Anesthesiology

## 2014-04-20 ENCOUNTER — Encounter (HOSPITAL_COMMUNITY): Payer: Medicare Other | Admitting: Vascular Surgery

## 2014-04-20 ENCOUNTER — Encounter (HOSPITAL_COMMUNITY): Payer: Self-pay | Admitting: *Deleted

## 2014-04-20 DIAGNOSIS — C3490 Malignant neoplasm of unspecified part of unspecified bronchus or lung: Secondary | ICD-10-CM | POA: Diagnosis present

## 2014-04-20 DIAGNOSIS — S42293A Other displaced fracture of upper end of unspecified humerus, initial encounter for closed fracture: Principal | ICD-10-CM | POA: Diagnosis present

## 2014-04-20 DIAGNOSIS — I10 Essential (primary) hypertension: Secondary | ICD-10-CM | POA: Diagnosis present

## 2014-04-20 DIAGNOSIS — W19XXXA Unspecified fall, initial encounter: Secondary | ICD-10-CM | POA: Diagnosis present

## 2014-04-20 DIAGNOSIS — K219 Gastro-esophageal reflux disease without esophagitis: Secondary | ICD-10-CM | POA: Diagnosis present

## 2014-04-20 DIAGNOSIS — M129 Arthropathy, unspecified: Secondary | ICD-10-CM | POA: Diagnosis present

## 2014-04-20 DIAGNOSIS — I498 Other specified cardiac arrhythmias: Secondary | ICD-10-CM | POA: Diagnosis present

## 2014-04-20 DIAGNOSIS — Z87891 Personal history of nicotine dependence: Secondary | ICD-10-CM

## 2014-04-20 DIAGNOSIS — S42209A Unspecified fracture of upper end of unspecified humerus, initial encounter for closed fracture: Secondary | ICD-10-CM | POA: Diagnosis present

## 2014-04-20 HISTORY — PX: SHOULDER HEMI-ARTHROPLASTY: SHX5049

## 2014-04-20 LAB — TYPE AND SCREEN
ABO/RH(D): A NEG
ANTIBODY SCREEN: NEGATIVE
UNIT DIVISION: 0
Unit division: 0

## 2014-04-20 LAB — PREPARE RBC (CROSSMATCH)

## 2014-04-20 SURGERY — HEMIARTHROPLASTY, SHOULDER
Anesthesia: Regional | Site: Shoulder | Laterality: Right

## 2014-04-20 MED ORDER — FENTANYL CITRATE 0.05 MG/ML IJ SOLN
INTRAMUSCULAR | Status: AC
  Filled 2014-04-20: qty 5

## 2014-04-20 MED ORDER — PHENOL 1.4 % MT LIQD: 1.0000 | OROMUCOSAL | Status: DC | PRN

## 2014-04-20 MED ORDER — ONDANSETRON HCL 4 MG PO TABS: 4.0000 mg | ORAL_TABLET | Freq: Three times a day (TID) | ORAL | Status: DC | PRN

## 2014-04-20 MED ORDER — FOLIC ACID 1 MG PO TABS
1.0000 mg | ORAL_TABLET | Freq: Every day | ORAL | Status: DC
  Administered 2014-04-20 – 2014-04-21 (×2): 1 mg via ORAL
  Filled 2014-04-20 (×3): qty 1

## 2014-04-20 MED ORDER — IBUPROFEN 600 MG PO TABS
600.0000 mg | ORAL_TABLET | Freq: Four times a day (QID) | ORAL | Status: DC | PRN
  Filled 2014-04-20: qty 1

## 2014-04-20 MED ORDER — PROPOFOL 10 MG/ML IV BOLUS
INTRAVENOUS | Status: AC
  Filled 2014-04-20: qty 20

## 2014-04-20 MED ORDER — HYDROMORPHONE HCL PF 1 MG/ML IJ SOLN
0.5000 mg | INTRAMUSCULAR | Status: DC | PRN
  Administered 2014-04-20 – 2014-04-21 (×2): 1 mg via INTRAVENOUS
  Filled 2014-04-20 (×2): qty 1

## 2014-04-20 MED ORDER — CEFAZOLIN SODIUM 1-5 GM-% IV SOLN
1.0000 g | Freq: Four times a day (QID) | INTRAVENOUS | Status: AC
  Administered 2014-04-20 – 2014-04-21 (×3): 1 g via INTRAVENOUS
  Filled 2014-04-20 (×3): qty 50

## 2014-04-20 MED ORDER — ONDANSETRON HCL 4 MG/2ML IJ SOLN
INTRAMUSCULAR | Status: DC | PRN
  Administered 2014-04-20: 4 mg via INTRAVENOUS

## 2014-04-20 MED ORDER — PROPOFOL 10 MG/ML IV BOLUS
INTRAVENOUS | Status: DC | PRN
  Administered 2014-04-20: 40 mg via INTRAVENOUS
  Administered 2014-04-20: 100 mg via INTRAVENOUS

## 2014-04-20 MED ORDER — GLYCOPYRROLATE 0.2 MG/ML IJ SOLN
INTRAMUSCULAR | Status: DC | PRN
  Administered 2014-04-20: .4 mg via INTRAVENOUS

## 2014-04-20 MED ORDER — LACTATED RINGERS IV SOLN
INTRAVENOUS | Status: DC | PRN
  Administered 2014-04-20 (×2): via INTRAVENOUS

## 2014-04-20 MED ORDER — PROMETHAZINE HCL 25 MG/ML IJ SOLN: 6.2500 mg | INTRAMUSCULAR | Status: DC | PRN

## 2014-04-20 MED ORDER — SODIUM CHLORIDE 0.9 % IR SOLN
Status: DC | PRN
  Administered 2014-04-20: 1000 mL

## 2014-04-20 MED ORDER — TRIAMTERENE-HCTZ 37.5-25 MG PO TABS
1.0000 | ORAL_TABLET | Freq: Every day | ORAL | Status: DC
  Administered 2014-04-21: 1 via ORAL
  Filled 2014-04-20 (×2): qty 1

## 2014-04-20 MED ORDER — SIMVASTATIN 20 MG PO TABS
20.0000 mg | ORAL_TABLET | Freq: Every evening | ORAL | Status: DC
  Administered 2014-04-20 – 2014-04-21 (×2): 20 mg via ORAL
  Filled 2014-04-20 (×3): qty 1

## 2014-04-20 MED ORDER — ONDANSETRON HCL 4 MG/2ML IJ SOLN
INTRAMUSCULAR | Status: AC
  Filled 2014-04-20: qty 2

## 2014-04-20 MED ORDER — ONDANSETRON HCL 4 MG/2ML IJ SOLN
4.0000 mg | Freq: Four times a day (QID) | INTRAMUSCULAR | Status: DC | PRN
  Administered 2014-04-20 – 2014-04-21 (×2): 4 mg via INTRAVENOUS
  Filled 2014-04-20 (×2): qty 2

## 2014-04-20 MED ORDER — MIDAZOLAM HCL 5 MG/5ML IJ SOLN
INTRAMUSCULAR | Status: DC | PRN
  Administered 2014-04-20 (×2): 1 mg via INTRAVENOUS

## 2014-04-20 MED ORDER — CEFAZOLIN SODIUM-DEXTROSE 2-3 GM-% IV SOLR
INTRAVENOUS | Status: AC
  Filled 2014-04-20: qty 50

## 2014-04-20 MED ORDER — ROCURONIUM BROMIDE 100 MG/10ML IV SOLN
INTRAVENOUS | Status: DC | PRN
  Administered 2014-04-20: 50 mg via INTRAVENOUS

## 2014-04-20 MED ORDER — LIDOCAINE HCL (CARDIAC) 20 MG/ML IV SOLN
INTRAVENOUS | Status: AC
  Filled 2014-04-20: qty 10

## 2014-04-20 MED ORDER — SODIUM CHLORIDE 0.9 % IR SOLN
Status: DC | PRN
  Administered 2014-04-20 (×3): 1000 mL

## 2014-04-20 MED ORDER — ACETAMINOPHEN 650 MG RE SUPP: 650.0000 mg | Freq: Four times a day (QID) | RECTAL | Status: DC | PRN

## 2014-04-20 MED ORDER — BUPIVACAINE-EPINEPHRINE PF 0.5-1:200000 % IJ SOLN
INTRAMUSCULAR | Status: DC | PRN
  Administered 2014-04-20: 25 mL via PERINEURAL

## 2014-04-20 MED ORDER — PHENYLEPHRINE HCL 10 MG/ML IJ SOLN
10.0000 mg | INTRAVENOUS | Status: DC | PRN
  Administered 2014-04-20: 20 ug/min via INTRAVENOUS

## 2014-04-20 MED ORDER — ROCURONIUM BROMIDE 50 MG/5ML IV SOLN
INTRAVENOUS | Status: AC
  Filled 2014-04-20: qty 1

## 2014-04-20 MED ORDER — CALCIUM CITRATE-VITAMIN D 500-400 MG-UNIT PO CHEW
2.0000 | CHEWABLE_TABLET | Freq: Every morning | ORAL | Status: DC
  Filled 2014-04-20: qty 2

## 2014-04-20 MED ORDER — SODIUM CHLORIDE 0.9 % IV SOLN
INTRAVENOUS | Status: DC | PRN
  Administered 2014-04-20: 11:00:00 via INTRAVENOUS

## 2014-04-20 MED ORDER — SENNOSIDES-DOCUSATE SODIUM 8.6-50 MG PO TABS
1.0000 | ORAL_TABLET | Freq: Two times a day (BID) | ORAL | Status: DC
  Administered 2014-04-20 – 2014-04-22 (×4): 1 via ORAL
  Filled 2014-04-20 (×4): qty 1

## 2014-04-20 MED ORDER — METOCLOPRAMIDE HCL 5 MG/ML IJ SOLN
5.0000 mg | Freq: Three times a day (TID) | INTRAMUSCULAR | Status: DC | PRN
  Administered 2014-04-21: 10 mg via INTRAVENOUS
  Filled 2014-04-20: qty 2

## 2014-04-20 MED ORDER — LIDOCAINE HCL (CARDIAC) 20 MG/ML IV SOLN
INTRAVENOUS | Status: DC | PRN
  Administered 2014-04-20: 50 mg via INTRAVENOUS

## 2014-04-20 MED ORDER — NEOSTIGMINE METHYLSULFATE 1 MG/ML IJ SOLN
INTRAMUSCULAR | Status: DC | PRN
  Administered 2014-04-20: 3 mg via INTRAVENOUS

## 2014-04-20 MED ORDER — FENTANYL CITRATE 0.05 MG/ML IJ SOLN
INTRAMUSCULAR | Status: DC | PRN
  Administered 2014-04-20: 100 ug via INTRAVENOUS

## 2014-04-20 MED ORDER — METOCLOPRAMIDE HCL 10 MG PO TABS: 5.0000 mg | ORAL_TABLET | Freq: Three times a day (TID) | ORAL | Status: DC | PRN

## 2014-04-20 MED ORDER — POLYETHYLENE GLYCOL 3350 17 G PO PACK
17.0000 g | PACK | Freq: Every day | ORAL | Status: DC
  Filled 2014-04-20 (×2): qty 1

## 2014-04-20 MED ORDER — VERAPAMIL HCL ER 240 MG PO TBCR
240.0000 mg | EXTENDED_RELEASE_TABLET | Freq: Every day | ORAL | Status: DC
  Administered 2014-04-21 – 2014-04-22 (×2): 240 mg via ORAL
  Filled 2014-04-20 (×3): qty 1

## 2014-04-20 MED ORDER — METHOCARBAMOL 500 MG PO TABS
500.0000 mg | ORAL_TABLET | Freq: Four times a day (QID) | ORAL | Status: DC | PRN
  Administered 2014-04-20 – 2014-04-21 (×3): 500 mg via ORAL
  Filled 2014-04-20 (×3): qty 1

## 2014-04-20 MED ORDER — ASPIRIN 325 MG PO TABS
325.0000 mg | ORAL_TABLET | ORAL | Status: DC
  Administered 2014-04-20 – 2014-04-21 (×2): 325 mg via ORAL
  Filled 2014-04-20 (×3): qty 1

## 2014-04-20 MED ORDER — METHOCARBAMOL 100 MG/ML IJ SOLN
500.0000 mg | Freq: Four times a day (QID) | INTRAMUSCULAR | Status: DC | PRN
  Filled 2014-04-20: qty 5

## 2014-04-20 MED ORDER — ONDANSETRON HCL 4 MG PO TABS
4.0000 mg | ORAL_TABLET | Freq: Four times a day (QID) | ORAL | Status: DC | PRN
  Filled 2014-04-20: qty 1

## 2014-04-20 MED ORDER — NEOSTIGMINE METHYLSULFATE 1 MG/ML IJ SOLN
INTRAMUSCULAR | Status: AC
  Filled 2014-04-20: qty 10

## 2014-04-20 MED ORDER — ACETAMINOPHEN 325 MG PO TABS: 650.0000 mg | ORAL_TABLET | Freq: Four times a day (QID) | ORAL | Status: DC | PRN

## 2014-04-20 MED ORDER — POTASSIUM CHLORIDE IN NACL 20-0.9 MEQ/L-% IV SOLN
INTRAVENOUS | Status: AC
  Administered 2014-04-20: 20:00:00 via INTRAVENOUS
  Filled 2014-04-20 (×2): qty 1000

## 2014-04-20 MED ORDER — MIDAZOLAM HCL 2 MG/2ML IJ SOLN
INTRAMUSCULAR | Status: AC
  Filled 2014-04-20: qty 2

## 2014-04-20 MED ORDER — ADULT MULTIVITAMIN W/MINERALS CH
1.0000 | ORAL_TABLET | Freq: Two times a day (BID) | ORAL | Status: DC
  Administered 2014-04-21: 1 via ORAL
  Filled 2014-04-20 (×5): qty 1

## 2014-04-20 MED ORDER — MENTHOL 3 MG MT LOZG: 1.0000 | LOZENGE | OROMUCOSAL | Status: DC | PRN

## 2014-04-20 MED ORDER — GLYCOPYRROLATE 0.2 MG/ML IJ SOLN
INTRAMUSCULAR | Status: AC
  Filled 2014-04-20: qty 2

## 2014-04-20 MED ORDER — HYDROMORPHONE HCL PF 1 MG/ML IJ SOLN: 0.2500 mg | INTRAMUSCULAR | Status: DC | PRN

## 2014-04-20 MED ORDER — OXYCODONE HCL 5 MG PO TABS
5.0000 mg | ORAL_TABLET | ORAL | Status: DC | PRN
  Administered 2014-04-20 – 2014-04-22 (×8): 10 mg via ORAL
  Filled 2014-04-20 (×9): qty 2

## 2014-04-20 MED ORDER — ONDANSETRON HCL 4 MG/2ML IJ SOLN
INTRAMUSCULAR | Status: AC
Start: 2014-04-20 — End: 2014-04-21
  Filled 2014-04-20: qty 2

## 2014-04-20 MED ORDER — ALBUMIN HUMAN 5 % IV SOLN
INTRAVENOUS | Status: DC | PRN
  Administered 2014-04-20: 10:00:00 via INTRAVENOUS

## 2014-04-20 SURGICAL SUPPLY — 76 items
BENZOIN TINCTURE PRP APPL 2/3 (GAUZE/BANDAGES/DRESSINGS) ×3 IMPLANT
BLADE SAW SAG 29X58X.64 (BLADE) IMPLANT
BLADE SAW SGTL 83.5X18.5 (BLADE) ×3 IMPLANT
BLADE SURG 15 STRL LF DISP TIS (BLADE) ×2 IMPLANT
BLADE SURG 15 STRL SS (BLADE) ×4
CARTRIDGE CURVETEK MED (MISCELLANEOUS) IMPLANT
CARTRIDGE CURVETEK XLRG (MISCELLANEOUS) IMPLANT
CEMENT BONE COBALT (Cement) ×6 IMPLANT
CLOSURE WOUND 1/2 X4 (GAUZE/BANDAGES/DRESSINGS) ×1
COVER BACK TABLE 24X17X13 BIG (DRAPES) IMPLANT
COVER SURGICAL LIGHT HANDLE (MISCELLANEOUS) ×3 IMPLANT
DRAPE C-ARM 42X72 X-RAY (DRAPES) ×3 IMPLANT
DRAPE INCISE IOBAN 66X45 STRL (DRAPES) ×6 IMPLANT
DRAPE ORTHO SPLIT 77X108 STRL (DRAPES) ×2
DRAPE SURG ORHT 6 SPLT 77X108 (DRAPES) ×1 IMPLANT
DRAPE U-SHAPE 47X51 STRL (DRAPES) ×6 IMPLANT
DRSG PAD ABDOMINAL 8X10 ST (GAUZE/BANDAGES/DRESSINGS) ×3 IMPLANT
DURAPREP 26ML APPLICATOR (WOUND CARE) ×6 IMPLANT
ELECT REM PT RETURN 9FT ADLT (ELECTROSURGICAL) ×3
ELECTRODE REM PT RTRN 9FT ADLT (ELECTROSURGICAL) ×1 IMPLANT
GAUZE XEROFORM 1X8 LF (GAUZE/BANDAGES/DRESSINGS) ×3 IMPLANT
GLOVE BIOGEL PI IND STRL 8 (GLOVE) ×1 IMPLANT
GLOVE BIOGEL PI INDICATOR 8 (GLOVE) ×2
GLOVE SURG ORTHO 8.0 STRL STRW (GLOVE) ×6 IMPLANT
GOWN STRL REUS W/ TWL LRG LVL3 (GOWN DISPOSABLE) ×2 IMPLANT
GOWN STRL REUS W/ TWL XL LVL3 (GOWN DISPOSABLE) ×1 IMPLANT
GOWN STRL REUS W/TWL LRG LVL3 (GOWN DISPOSABLE) ×4
GOWN STRL REUS W/TWL XL LVL3 (GOWN DISPOSABLE) ×2
HANDPIECE INTERPULSE COAX TIP (DISPOSABLE) ×2
HUMERAL HEAD STERILE 44MMX17MM (Orthopedic Implant) ×3 IMPLANT
KIT BASIN OR (CUSTOM PROCEDURE TRAY) ×3 IMPLANT
KIT OPTIVAC 80 GRAM DOUBLE MIX (Orthopedic Implant) ×3 IMPLANT
KIT ROOM TURNOVER OR (KITS) ×3 IMPLANT
LOOP VESSEL MAXI BLUE (MISCELLANEOUS) ×3 IMPLANT
MANIFOLD NEPTUNE II (INSTRUMENTS) ×3 IMPLANT
NEEDLE 1/2 CIR CATGUT .05X1.09 (NEEDLE) IMPLANT
NEEDLE HYPO 25GX1X1/2 BEV (NEEDLE) IMPLANT
NOZZLE CEMENT SMALL (Cement) ×2 IMPLANT
NS IRRIG 1000ML POUR BTL (IV SOLUTION) ×3 IMPLANT
OPTIVAC KIT ×3 IMPLANT
PACK SHOULDER (CUSTOM PROCEDURE TRAY) ×3 IMPLANT
PAD ABD 8X10 STRL (GAUZE/BANDAGES/DRESSINGS) ×3 IMPLANT
PAD ARMBOARD 7.5X6 YLW CONV (MISCELLANEOUS) ×6 IMPLANT
PASSER SUT SWANSON 36MM LOOP (INSTRUMENTS) IMPLANT
RESTRICTOR CEMENT PE SZ 2 (Cement) ×3 IMPLANT
SET HNDPC FAN SPRY TIP SCT (DISPOSABLE) ×1 IMPLANT
SLEEVE HUMERAL PMMA 11MMX7MM 1 (Sleeve) ×3 IMPLANT
SLEEVE SURGEON STRL (DRAPES) ×3 IMPLANT
SLING ARM IMMOBILIZER MED (SOFTGOODS) ×3 IMPLANT
SMALL DIAMETER CEMENT NOZZLE ×3 IMPLANT
SPONGE GAUZE 4X4 12PLY (GAUZE/BANDAGES/DRESSINGS) ×3 IMPLANT
SPONGE LAP 4X18 X RAY DECT (DISPOSABLE) ×6 IMPLANT
STAPLER VISISTAT 35W (STAPLE) IMPLANT
STEM HUMERAL STRL 8MMX140MM (Stem) ×3 IMPLANT
STRIP CLOSURE SKIN 1/2X4 (GAUZE/BANDAGES/DRESSINGS) ×2 IMPLANT
SUCTION FRAZIER TIP 10 FR DISP (SUCTIONS) ×3 IMPLANT
SUT ETHIBOND 2 V 37 (SUTURE) ×3 IMPLANT
SUT ETHIBOND NAB CT1 #1 30IN (SUTURE) IMPLANT
SUT FIBERWIRE #5 38 CONV NDL (SUTURE)
SUT MAXBRAID (SUTURE) ×21 IMPLANT
SUT MERSILENE 5MM BP 1 12 (SUTURE) IMPLANT
SUT VIC AB 0 CT1 27 (SUTURE) ×8
SUT VIC AB 0 CT1 27XBRD ANBCTR (SUTURE) ×4 IMPLANT
SUT VIC AB 0 CTB1 27 (SUTURE) ×3 IMPLANT
SUT VIC AB 2-0 CT1 27 (SUTURE) ×2
SUT VIC AB 2-0 CT1 TAPERPNT 27 (SUTURE) ×1 IMPLANT
SUT VIC AB 2-0 CTB1 (SUTURE) IMPLANT
SUT VICRYL AB 2 0 TIES (SUTURE) IMPLANT
SUTURE FIBERWR #5 38 CONV NDL (SUTURE) IMPLANT
SYR CONTROL 10ML LL (SYRINGE) IMPLANT
TAPE CLOTH SURG 6X10 WHT LF (GAUZE/BANDAGES/DRESSINGS) ×3 IMPLANT
TOWEL OR 17X24 6PK STRL BLUE (TOWEL DISPOSABLE) ×3 IMPLANT
TOWEL OR 17X26 10 PK STRL BLUE (TOWEL DISPOSABLE) ×3 IMPLANT
TOWER CARTRIDGE SMART MIX (DISPOSABLE) ×3 IMPLANT
TRAY FOLEY CATH 16FRSI W/METER (SET/KITS/TRAYS/PACK) ×3 IMPLANT
WATER STERILE IRR 1000ML POUR (IV SOLUTION) IMPLANT

## 2014-04-20 NOTE — Transfer of Care (Signed)
Immediate Anesthesia Transfer of Care Note  Patient: Kristine Horn  Procedure(s) Performed: Procedure(s) with comments: SHOULDER HEMI-ARTHROPLASTY CEMENTED (Right) - RIGHT SHOULDER HEMI-ARTHROPLASTY CEMENTED  Patient Location: PACU  Anesthesia Type:General and Regional  Level of Consciousness: awake, alert , oriented and patient cooperative  Airway & Oxygen Therapy: Patient Spontanous Breathing and Patient connected to nasal cannula oxygen  Post-op Assessment: Report given to PACU RN, Post -op Vital signs reviewed and stable and Patient moving all extremities  Post vital signs: Reviewed and stable  Complications: No apparent anesthesia complications

## 2014-04-20 NOTE — Anesthesia Postprocedure Evaluation (Signed)
  Anesthesia Post-op Note  Patient: Kristine Horn  Procedure(s) Performed: Procedure(s) with comments: SHOULDER HEMI-ARTHROPLASTY CEMENTED (Right) - RIGHT SHOULDER HEMI-ARTHROPLASTY CEMENTED  Patient Location: PACU  Anesthesia Type:GA combined with regional for post-op pain  Level of Consciousness: awake and alert   Airway and Oxygen Therapy: Patient Spontanous Breathing  Post-op Pain: none  Post-op Assessment: Post-op Vital signs reviewed  Post-op Vital Signs: stable  Last Vitals:  Filed Vitals:   04/20/14 1630  BP: 108/44  Pulse:   Temp:   Resp: 18    Complications: No apparent anesthesia complications

## 2014-04-20 NOTE — Brief Op Note (Signed)
04/20/2014  12:20 PM  PATIENT:  Kristine Horn  73 y.o. female  PRE-OPERATIVE DIAGNOSIS:  RIGHT SHOULDER FRACTURE  POST-OPERATIVE DIAGNOSIS:  right shoulder fracture  PROCEDURE:  Procedure(s): SHOULDER HEMI-ARTHROPLASTY CEMENTED  SURGEON:  Surgeon(s): Meredith Pel, MD  ASSISTANT: s vernon pa  ANESTHESIA:   regional and general  EBL: 250 ml    Total I/O In: 2170 [I.V.:1000; Blood:670; IV IDUPBDHDI:978] Out: 4784 [Urine:500; Blood:575]  BLOOD ADMINISTERED: 2 u prbce  DRAINS: none   LOCAL MEDICATIONS USED:  none  SPECIMEN:  No Specimen  COUNTS:  YES  TOURNIQUET:  * No tourniquets in log *  DICTATION: .Other Dictation: Dictation Number [id number]  PLAN OF CARE: Admit to inpatient   PATIENT DISPOSITION:  PACU - hemodynamically stable

## 2014-04-20 NOTE — Anesthesia Preprocedure Evaluation (Addendum)
Anesthesia Evaluation  Patient identified by MRN, date of birth, ID band Patient awake    Reviewed: Allergy & Precautions, H&P , NPO status , Patient's Chart, lab work & pertinent test results  History of Anesthesia Complications (+) PONV  Airway Mallampati: I      Dental   Pulmonary former smoker,  Pulmonary granulomatosis,  Ca.(stable)., hx. Of VATs         Cardiovascular hypertension, + dysrhythmias Supra Ventricular Tachycardia Rhythm:Regular Rate:Normal     Neuro/Psych negative neurological ROS     GI/Hepatic Neg liver ROS, GERD-  Controlled,  Endo/Other    Renal/GU      Musculoskeletal  (+) Arthritis -,   Abdominal   Peds  Hematology H/H  9.7/30  2nd chemotherapy   Anesthesia Other Findings   Reproductive/Obstetrics                          Anesthesia Physical Anesthesia Plan  ASA: III  Anesthesia Plan: General and Regional   Post-op Pain Management: MAC Combined w/ Regional for Post-op pain   Induction: Intravenous  Airway Management Planned: Oral ETT  Additional Equipment:   Intra-op Plan:   Post-operative Plan: Extubation in OR  Informed Consent: I have reviewed the patients History and Physical, chart, labs and discussed the procedure including the risks, benefits and alternatives for the proposed anesthesia with the patient or authorized representative who has indicated his/her understanding and acceptance.   Dental advisory given  Plan Discussed with:   Anesthesia Plan Comments:         Anesthesia Quick Evaluation

## 2014-04-20 NOTE — Anesthesia Procedure Notes (Signed)
Anesthesia Regional Block:  Supraclavicular block  Pre-Anesthetic Checklist: ,, timeout performed, Correct Patient, Correct Site, Correct Laterality, Correct Procedure, Correct Position, site marked, Risks and benefits discussed,  Surgical consent,  Pre-op evaluation,  At surgeon's request and post-op pain management  Laterality: Left and Upper  Prep: chloraprep       Needles:   Needle Type: Echogenic Needle     Needle Length: 9cm 9 cm Needle Gauge: 22 and 22 G  Needle insertion depth: 5 cm   Additional Needles:  Procedures: ultrasound guided (picture in chart) and nerve stimulator Supraclavicular block Narrative:  Start time: 04/20/2014 7:00 AM End time: 04/20/2014 7:20 AM Injection made incrementally with aspirations every 5 mL.  Performed by: Personally  Anesthesiologist: T Annina Piotrowski  Additional Notes: Tolerated well

## 2014-04-20 NOTE — H&P (Signed)
Kristine Horn is an 73 y.o. female.   Chief Complaint: Right shoulder pain HPI: Kristine Horn is a 73 year old female with right shoulder pain she sustained a fall approximately 2 weeks ago had a comminuted four-part proximal humerus fracture initially mildly displaced however on subsequent radiographic exam the fracture came orders placed she presents now for operative management including hemiarthroplasty versus reverse total shoulder replacement. Notably the patient does have lung cancer. I talked with her hematologist oncologist he describes the patient to be in good condition. He recommended intervention to improve quality of life. Patient will have potential to 2-3 years of life remaining before be cancer or becomes overwhelming. This is according to her hematology oncologist.  Past Medical History  Diagnosis Date  . GERD (gastroesophageal reflux disease)   . Hypertension   . Hyperlipidemia   . Pulmonary granulomatosis   . Paroxysmal SVT (supraventricular tachycardia)     Sees [facility]  . RHA (rheumatoid arthritis)   . Lung cancer     on Tarceva now.  Marland Kitchen Dysrhythmia     SVT  . Phlebitis     superficial   . Anemia     with chemo  . Hx of transfusion of packed red blood cells 07/09/2013    had 2 units  . Constipation   . Complication of anesthesia   . PONV (postoperative nausea and vomiting)     after knee replacement in January 2014.    Past Surgical History  Procedure Laterality Date  . Tracheostomy      Age 42  . Tonsillectomy    . Cataract extraction  01/2012    left eye  . Total knee arthroplasty  01/20/2013    LEFT KNEE  . Total knee arthroplasty  01/20/2013    Procedure: TOTAL KNEE ARTHROPLASTY;  Surgeon: Meredith Pel, MD;  Location: [place];  Service: Orthopedics;  Laterality: Left;  Left total Knee Arthroplasty  . Video assisted thoracoscopy (vats)/thorocotomy  03/2013  . Port-a-cath insertion  04/2013  . Colonoscopy      Family History  Problem  Relation Age of Onset  . CVA Mother   . Hypertension Mother    Social History:  reports that she quit smoking about 46 years ago. Her smoking use included Cigarettes. She has a 15 pack-year smoking history. She has never used smokeless tobacco. She reports that she drinks alcohol. She reports that she does not use illicit drugs.  Allergies:  Allergies  Allergen Reactions  . Indocin [Indomethacin] Other (See Comments)    Long acting causes migraines.   . Lipitor [Atorvastatin] Other (See Comments)    Muscle aches  . Morphine And Related Itching    "looney"  . Percodan [Oxycodone-Aspirin] Nausea And Vomiting    Can take percocet    Medications Prior to Admission  Medication Sig Dispense Refill  . Calcium Carbonate-Vit D-Min (CALCIUM 1200 PO) Take 1,200 mg by mouth 2 (two) times daily.       Marland Kitchen erlotinib (TARCEVA) 25 MG tablet Take 50 mg by mouth daily. Take on an empty stomach 1 hour before meals or 2 hours after.      . folic acid (FOLVITE) 1 MG tablet Take 1 mg by mouth daily.      . hydroxychloroquine (PLAQUENIL) 200 MG tablet Take 200 mg by mouth 2 (two) times daily.      Marland Kitchen ibuprofen (ADVIL,MOTRIN) 200 MG tablet Take 600 mg by mouth every 6 (six) hours as needed.      Marland Kitchen  methotrexate 2.5 MG tablet Take 15 mg by mouth once a week. Friday      . Multiple Vitamin (MULTIVITAMIN WITH MINERALS) TABS Take 1 tablet by mouth 2 (two) times daily.      . ondansetron (ZOFRAN) 4 MG tablet Take 4 mg by mouth every 8 (eight) hours as needed for nausea or vomiting.      Marland Kitchen oxyCODONE-acetaminophen (PERCOCET/ROXICET) 5-325 MG per tablet Take 1-2 tablets by mouth every 6 (six) hours as needed for severe pain.  20 tablet  0  . polyethylene glycol (MIRALAX / GLYCOLAX) packet Take 17 g by mouth daily.      Marland Kitchen senna-docusate (SENOKOT-S) 8.6-50 MG per tablet Take 1 tablet by mouth 2 (two) times daily.      . simvastatin (ZOCOR) 20 MG tablet Take 20 mg by mouth every evening.      .  triamterene-hydrochlorothiazide (MAXZIDE-25) 37.5-25 MG per tablet Take 1 tablet by mouth daily.      . verapamil (CALAN-SR) 240 MG CR tablet Take 240 mg by mouth daily.        No results found for this or any previous visit (from the past 48 hour(s)). No results found.  Review of Systems  Constitutional: Negative.   HENT: Negative.   Eyes: Negative.   Respiratory: Negative.   Cardiovascular: Negative.   Gastrointestinal: Negative.   Genitourinary: Negative.   Musculoskeletal: Positive for joint pain.  Skin: Negative.   Neurological: Negative.   Endo/Heme/Allergies: Negative.   Psychiatric/Behavioral: Negative.     Pulse 71, temperature 97 F (36.1 C), temperature source Oral, resp. rate 18, height 5\' 7"  (1.702 m), weight 77.1 kg (169 lb 15.6 oz), SpO2 99.00%. Physical Exam  Constitutional: She appears well-developed.  HENT:  Head: Normocephalic.  Eyes: Pupils are equal, round, and reactive to light.  Neck: Normal range of motion.  Cardiovascular: Normal rate.   Respiratory: Effort normal.  Neurological: She is alert.  Skin: Skin is warm.  Psychiatric: She has a normal mood and affect.   right shoulder exam demonstrates decreased swelling but there is some pain with range of motion passive and active deltoid does fire grip EPL FPL interosseous is intact. Elbow wrist range of motion is intact. Neck range of motion full  Assessment/Plan Impression is displaced right proximal humerus fracture plan right shoulder hemiarthroplasty versus reverse total shoulder replacement. No dependence on the quality of the tuberosity fragments as well as the bone quality and status of the rotator cuff as to whether not will go with hemiarthroplasty versus reverse. It my initial inclination is to try the hemiarthroplasty if her rotator cuff is intact and if the bone quality is adequate. Risk and benefits including but limited to stiffness infection nerve vessel damage prolonged recovery are  discussed with the patient and family all questions answered  Meredith Pel 04/20/2014, 7:13 AM

## 2014-04-21 LAB — TYPE AND SCREEN
ABO/RH(D): A NEG
Antibody Screen: NEGATIVE
UNIT DIVISION: 0
Unit division: 0

## 2014-04-21 LAB — POCT I-STAT 4, (NA,K, GLUC, HGB,HCT)
Glucose, Bld: 111 mg/dL — ABNORMAL HIGH (ref 70–99)
Glucose, Bld: 104 mg/dL — ABNORMAL HIGH (ref 70–99)
HCT: 23 % — ABNORMAL LOW (ref 36.0–46.0)
HCT: 27 % — ABNORMAL LOW (ref 36.0–46.0)
Hemoglobin: 7.8 g/dL — ABNORMAL LOW (ref 12.0–15.0)
Hemoglobin: 9.2 g/dL — ABNORMAL LOW (ref 12.0–15.0)
Potassium: 3.8 mEq/L (ref 3.7–5.3)
Potassium: 3.7 mEq/L (ref 3.7–5.3)
Sodium: 138 mEq/L (ref 137–147)
Sodium: 139 mEq/L (ref 137–147)

## 2014-04-21 LAB — BASIC METABOLIC PANEL
BUN: 13 mg/dL (ref 6–23)
CO2: 23 mEq/L (ref 19–32)
Calcium: 8.6 mg/dL (ref 8.4–10.5)
Chloride: 100 mEq/L (ref 96–112)
Creatinine, Ser: 0.87 mg/dL (ref 0.50–1.10)
GFR, EST AFRICAN AMERICAN: 75 mL/min — AB (ref >90)
GFR, EST NON AFRICAN AMERICAN: 65 mL/min — AB (ref >90)
Glucose, Bld: 130 mg/dL — ABNORMAL HIGH (ref 70–99)
POTASSIUM: 3.6 meq/L — AB (ref 3.7–5.3)
Sodium: 135 mEq/L — ABNORMAL LOW (ref 137–147)

## 2014-04-21 LAB — CBC
HEMATOCRIT: 27.7 % — AB (ref 36.0–46.0)
HEMOGLOBIN: 9.3 g/dL — AB (ref 12.0–15.0)
MCH: 32.9 pg (ref 26.0–34.0)
MCHC: 33.6 g/dL (ref 30.0–36.0)
MCV: 97.9 fL (ref 78.0–100.0)
Platelets: 218 10*3/uL (ref 150–400)
RBC: 2.83 MIL/uL — AB (ref 3.87–5.11)
RDW: 17.2 % — ABNORMAL HIGH (ref 11.5–15.5)
WBC: 6 10*3/uL (ref 4.0–10.5)

## 2014-04-21 MED ORDER — CALCIUM CARBONATE-VITAMIN D 500-200 MG-UNIT PO TABS
2.0000 | ORAL_TABLET | Freq: Every day | ORAL | Status: DC
  Filled 2014-04-21 (×4): qty 2

## 2014-04-21 NOTE — Progress Notes (Signed)
Occupational Therapy Evaluation Patient Details Name: Kristine Horn MRN: [id number] DOB: [birth year] Today's Date: 04/21/2014    History of Present Illness Pt is 73 y.o Female s/p R shoulder hemi-arthroplasty following proximal humeral fx after a fall. Pt notably with Lung Cancer, in good condition per hematology oncologist.   Clinical Impression   PTA pt lived at home with husband. Since her fall resulting in humeral fx, pt has needed assistance from husband for dressing and bathing as well as homemaking tasks. Pt reports that her husband, brother, and daughter will be available to assist with ADLs. Pt declined OOB activities today but reports that she has been up in her room with no LOB or dizziness and some nausea, possibly related to medications. Education and training completed for compensatory techniques for UB ADLs. Pt performed ROM for elbow, wrist, and hand x10 with therapist assist for sustained stretch on full elbow extension. Pt would benefit from continued OT services to promote independence with ADLs and caregiver training. OT will also address balance and screen for possible PT needs during next session.    Follow Up Recommendations  Supervision/Assistance - 24 hour    Equipment Recommendations  None recommended by OT       Precautions / Restrictions Precautions Precautions: Shoulder Type of Shoulder Precautions: No AROM Shoulder Interventions: Shoulder sling/immobilizer;At all times;Off for dressing/bathing/exercises Precaution Booklet Issued: Yes (comment) Precaution Comments: MD ordered "OT for prom" in his note dated 4/22, however no orders for shoulder ROM are present. Left message with his office for clarification and completed only elbow, wrist, hand ROM at this time.  Required Braces or Orthoses: Sling Restrictions Weight Bearing Restrictions: Yes Other Position/Activity Restrictions: RUE NWB      Mobility Bed Mobility               General bed mobility  comments: not assessed, pt in bed and comfortable.   Transfers                 General transfer comment: Not assessed- pt in bed for duration, declined OOB         ADL Overall ADL's : Needs assistance/impaired Eating/Feeding: Set up;Sitting   Grooming: Set up;Sitting;Oral care;Wash/dry hands;Wash/dry face   Upper Body Bathing: Sitting;Moderate assistance   Lower Body Bathing: Minimal assistance;Sit to/from stand   Upper Body Dressing : Maximal assistance;Sitting;Adhering to UE precautions   Lower Body Dressing: Moderate assistance;Sit to/from stand                 General ADL Comments: Pt in bed for session as she declined OOB activities. Reports she has been moving around the room without LOB or dizziness. Pt reports nausea and reflux, possibly related to medications.      Vision  Per pt, no change from baseline.                           Pertinent Vitals/Pain Pt c/o "pulling" in R shoulder but no c/o pain. Reports "tenderness" following elbow ROM and requested muscle relaxer from RN.      Hand Dominance Right   Extremity/Trunk Assessment Upper Extremity Assessment Upper Extremity Assessment: RUE deficits/detail RUE Deficits / Details: No shoulder AROM RUE: Unable to fully assess due to pain;Unable to fully assess due to immobilization RUE Coordination: decreased fine motor;decreased gross motor   Lower Extremity Assessment Lower Extremity Assessment: Overall WFL for tasks assessed       Communication Communication Communication: No difficulties  Cognition Arousal/Alertness: Awake/alert Behavior During Therapy: WFL for tasks assessed/performed Overall Cognitive Status: Within Functional Limits for tasks assessed                        Exercises Exercises: Shoulder     Shoulder Instructions Shoulder Instructions Donning/doffing shirt without moving shoulder: Moderate assistance Method for sponge bathing under operated UE:  Minimal assistance Donning/doffing sling/immobilizer: Moderate assistance Correct positioning of sling/immobilizer: Minimal assistance ROM for elbow, wrist and digits of operated UE: Minimal assistance Sling wearing schedule (on at all times/off for ADL's): Supervision/safety Proper positioning of operated UE when showering: Supervision/safety Positioning of UE while sleeping: Minimal assistance    Home Living Family/patient expects to be discharged to:: Private residence Living Arrangements: Spouse/significant other Available Help at Discharge: Family;Available 24 hours/day (Husband, Daughter, brother available) Type of Home: House       Home Layout: Two level;Able to live on main level with bedroom/bathroom     Bathroom Shower/Tub: Occupational psychologist: Standard     Home Equipment: Shower seat - built in          Prior Functioning/Environment Level of Independence: Needs assistance    ADL's / Homemaking Assistance Needed: Pt reports husband has been assisting with dressing, bathing, and homemaking since her fall resulting in humeral fx        OT Diagnosis: Generalized weakness;Acute pain   OT Problem List: Decreased strength;Decreased range of motion;Decreased activity tolerance;Decreased knowledge of use of DME or AE;Decreased knowledge of precautions;Impaired UE functional use;Pain;Increased edema   OT Treatment/Interventions: Self-care/ADL training;Therapeutic exercise;Energy conservation;DME and/or AE instruction;Therapeutic activities;Patient/family education;Balance training    OT Goals(Current goals can be found in the care plan section) Acute Rehab OT Goals Patient Stated Goal: To return home OT Goal Formulation: With patient Time For Goal Achievement: 04/28/14 Potential to Achieve Goals: Good ADL Goals Pt Will Perform Upper Body Bathing: with min guard assist;with caregiver independent in assisting;sitting Pt Will Perform Upper Body Dressing:  with min assist;with caregiver independent in assisting;sitting Pt Will Transfer to Toilet: with supervision;ambulating;regular height toilet Pt Will Perform Tub/Shower Transfer: Shower transfer;with supervision;ambulating;shower seat Pt/caregiver will Perform Home Exercise Program: Increased ROM;Right Upper extremity;With Supervision;With written HEP provided Additional ADL Goal #1: Pt/caregiver with don/doff sling in correct positioning with supervision.  OT Frequency: Min 2X/week    End of Session Equipment Utilized During Treatment: Other (comment) (sling)  Activity Tolerance: Patient tolerated treatment well (pt in bed for duration) Patient left: in bed;with call bell/phone within reach;with family/visitor present   Time: 0927-1013 OT Time Calculation (min): 46 min Charges:  OT General Charges $OT Visit: 1 Procedure OT Evaluation $Initial OT Evaluation Tier I: 1 Procedure OT Treatments $Self Care/Home Management : 23-37 mins $Therapeutic Exercise: 8-22 mins  Juluis Rainier [phone] 04/21/2014, 10:41 AM

## 2014-04-21 NOTE — Op Note (Signed)
NAME:  Kristine Horn, Kristine Horn NO.:  [id number]  MEDICAL RECORD NO.:  [id number]  LOCATION:  [id number]                        FACILITY:  [facility]  PHYSICIAN:  Anderson Malta, M.D.    DATE OF BIRTH:  1941-12-24  DATE OF PROCEDURE: DATE OF DISCHARGE:                              OPERATIVE REPORT   PREOPERATIVE DIAGNOSIS:  Right proximal humerus fracture.  POSTOPERATIVE DIAGNOSIS:  Right proximal humerus fracture.  PROCEDURE:  Right proximal humerus fracture, hemiarthroplasty.  SURGEON:  Anderson Malta, M.D.  ASSISTANT:  Epimenio Foot, P.A.  ANESTHESIA:  General plus IV regional.  ESTIMATED BLOOD LOSS:  250.  DRAINS:  None.  INDICATIONS:  Kristine Horn is a patient with right proximal humerus fracture, 4 part which is unstable, presents for operative management after explanation of risks and benefits.  PROCEDURE IN DETAIL:  The patient was brought to the operating room where general endotracheal anesthesia was induced.  Perioperative antibiotics was induced and time-out called.  The patient was placed in a beach chair position with the head in neutral position.  The right arm was prescrubbed with alcohol, Betadine, which allowed to air dry, prepped with DuraPrep solution and draped in sterile manner.  Charlie Pitter was used to cover the operative field.  Time-out was called.  Deltopectoral approach was made.  Skin and tissues were sharply divided.  Cephalic vein was mobilized laterally.  Conjoined fascia between the conjoined tendon and the subscap was identified.  Kolbel retractor placed. Axillary nerve palpated at this time, protected at all times during the case.  Biceps tendon identified.  Bicipital groove was opened up.  The patient required osteotomy of one of the head fragments, __________part of head fragments from the lesser tuberosity.  Also required __________of the posterior head fragment from the greater tuberosity. Tuberosities were shelled out.  Bone graft was  utilized from the tuberosities and from the head.  The head was sized to 42 mm x 18. Sutures placed around the greater and lesser tuberosities.  Bone quality was adequate for suture fixation.  Shaft was then prepared by reaming. Humeral head height was difficult to assess.  Intraoperative fluoroscopy was utilized.  Screw __________tap guide was utilized and adjusted. Trial broach was placed in approximately 30 degrees of retroversion. Head height was adjusted in order to allow for 50% inferior subluxation and good anterior-posterior stability.  At this time, humeral canal was prepared, irrigation was performed, stem was cemented into position. Bone grafting was then performed around the tuberosities.  Suture fixation was then used to reapproximate the greater and lesser tuberosities, both the shaft and to the prosthesis __________themselves. All in all good reduction was achieved.  Bone graft was utilized.  The supraspinatus had pulled off the bone but 2 MaxBraid sutures were placed in the lateral aspect of the humeral shaft in order to facilitate repair of the supraspinatus which was not attached to bone.  Fluoroscopy did demonstrate reasonable position of the prosthesis.  Thorough irrigation was performed, 6 L of irrigating solution.  The biceps tendon was released and tenodesed to the __________tendons.  Deltopectoral interval was closed using 0 Vicryl suture followed by interrupted __________2-0 Vicryl suture and 3-0  Prolene.  Xeroform and Mepilex applied.  The patient tolerated the procedure well without immediate complications. Freda Munro Vernon's assistance required at all times during the case for retraction, neurovascular protection, her assistance was a medical necessity.     Anderson Malta, M.D.     GSD/MEDQ  D:  04/20/2014  T:  04/20/2014  Job:  [id number]

## 2014-04-21 NOTE — Progress Notes (Signed)
Utilization review completed.  

## 2014-04-21 NOTE — Progress Notes (Signed)
Patient BP running 110's/30's. MD was notified. Orders given to hold maxzide. Nursing will continue to monitor

## 2014-04-21 NOTE — Progress Notes (Signed)
Subjective: Pt stable   Objective: Vital signs in last 24 hours: Temp:  [96 F (35.6 C)-98.9 F (37.2 C)] 98.6 F (37 C) (04/22 0626) Pulse Rate:  [56-97] 75 (04/22 0626) Resp:  [16-26] 18 (04/22 0626) BP: (101-140)/(39-62) 140/62 mmHg (04/22 0626) SpO2:  [92 %-100 %] 99 % (04/22 0626)  Intake/Output from previous day: 04/21 0701 - 04/22 0700 In: 4337.5 [P.O.:360; I.V.:2657.5; Blood:670; IV Piggyback:650] Out: 2025 [Urine:1450; Blood:575] Intake/Output this shift:    Exam:  hand perfused mobile sensate  Labs:  Recent Labs  04/21/14 0430  HGB 9.3*    Recent Labs  04/21/14 0430  WBC 6.0  RBC 2.83*  HCT 27.7*  PLT 218    Recent Labs  04/21/14 0430  NA 135*  K 3.6*  CL 100  CO2 23  BUN 13  CREATININE 0.87  GLUCOSE 130*  CALCIUM 8.6   No results found for this basename: LABPT, INR,  in the last 72 hours  Assessment/Plan: Plan ot for prom today - dc am   Meredith Pel 04/21/2014, 7:22 AM

## 2014-04-22 MED ORDER — ASPIRIN 325 MG PO TABS: 325.0000 mg | ORAL_TABLET | ORAL | Status: AC

## 2014-04-22 MED ORDER — OXYCODONE HCL 5 MG PO TABS: 5.0000 mg | ORAL_TABLET | ORAL | Status: AC | PRN

## 2014-04-22 NOTE — Progress Notes (Signed)
Occupational Therapy Treatment and Discharge Patient Details Name: Kristine Horn MRN: [id number] DOB: [birth year] Today's Date: 04/22/2014    History of present illness Pt is 73 y.o Female s/p R shoulder hemi-arthroplasty following proximal humeral fx after a fall. Pt notably with Lung Cancer, in good condition per hematology oncologist.   OT comments  Pt seen today for ADLs and therapeutic exercise. Pt performed ROM for elbow, wrist, and hand with therapist supervision. Pt with improved elbow extension. Educated pt and daughter on compensatory technique for UB dressing and bathing. Pt reports she will have 24/7 assistance at home from family. No further acute OT needs. Pt ready for d/c from OT standpoint.  Follow Up Recommendations  Supervision/Assistance - 24 hour    Equipment Recommendations  None recommended by OT       Precautions / Restrictions Precautions Precautions: Shoulder Type of Shoulder Precautions: No AROM Shoulder Interventions: Shoulder sling/immobilizer;At all times;Off for dressing/bathing/exercises Precaution Booklet Issued: Yes (comment) Precaution Comments: MD ordered "OT for prom" in his note dated 4/22, however no orders for shoulder ROM are present. Left message with his office for clarification and completed only elbow, wrist, hand ROM at this time. Pt's daughter reports that MD wants pt to perform pendulums. Required Braces or Orthoses: Sling Restrictions Weight Bearing Restrictions: Yes RUE Weight Bearing: Non weight bearing       Mobility Bed Mobility               General bed mobility comments: Not assessed- pt in recliner  Transfers Overall transfer level: Modified independent Equipment used: None             General transfer comment: VC's for reaching back with L hand on recliner prior to sitting. Pt with slow movement    Balance Overall balance assessment: Modified Independent (slow, shuffling gait due to side effects of medications  pe. )                                 ADL Overall ADL's : Needs assistance/impaired Eating/Feeding: Set up;Sitting   Grooming: Set up;Sitting   Upper Body Bathing: Minimal assitance;Sitting   Lower Body Bathing: Supervison/ safety;Set up;Sit to/from stand   Upper Body Dressing : Moderate assistance;Sitting;Adhering to UE precautions   Lower Body Dressing: Minimal assistance;Sit to/from stand (assist for lace-up shoes)   Toilet Transfer: Modified Independent;Ambulation;Comfort height toilet;Grab bars Toilet Transfer Details (indicate cue type and reason): pt moved slowly with shuffling step; reports some nausea as a side effect of medications Toileting- Clothing Manipulation and Hygiene: Min guard;Sit to/from stand       Functional mobility during ADLs: Modified independent General ADL Comments: Pt with improved pain control today resulting in increased independence with ADLs.       Vision  Per pt, no change from baseline.                          Cognition  Arousal/Alertness: Awake/Alert Behavior During Therapy: WFL for tasks assessed/performed Overall Cognitive Status: Within Functional Limits for tasks assessed                         Exercises Shoulder Exercises Pendulum Exercise: PROM;Right;10 reps;Standing (holding bed rail with L hand) Elbow Flexion: AAROM;Right;10 reps;Supine Elbow Extension: PROM;Right;10 reps;Supine Wrist Flexion: AROM;Right;10 reps;Supine Wrist Extension: AROM;Right;10 reps;Supine Digit Composite Flexion: AROM;Right;10 reps;Supine Composite Extension: AROM;Right;10 reps;Supine Donning/doffing  shirt without moving shoulder: Moderate assistance Method for sponge bathing under operated UE: Supervision/safety Donning/doffing sling/immobilizer: Minimal assistance Correct positioning of sling/immobilizer: Minimal assistance Pendulum exercises (written home exercise program): Supervision/safety ROM for elbow, wrist  and digits of operated UE: Supervision/safety Sling wearing schedule (on at all times/off for ADL's): Supervision/safety Proper positioning of operated UE when showering: Supervision/safety Positioning of UE while sleeping: Minimal assistance   Shoulder Instructions Shoulder Instructions Donning/doffing shirt without moving shoulder: Moderate assistance Method for sponge bathing under operated UE: Supervision/safety Donning/doffing sling/immobilizer: Minimal assistance Correct positioning of sling/immobilizer: Minimal assistance Pendulum exercises (written home exercise program): Supervision/safety ROM for elbow, wrist and digits of operated UE: Supervision/safety Sling wearing schedule (on at all times/off for ADL's): Supervision/safety Proper positioning of operated UE when showering: Supervision/safety Positioning of UE while sleeping: Minimal assistance          Pertinent Vitals/ Pain       Pt reports pain is controlled today.             Progress Toward Goals  OT Goals(current goals can now be found in the care plan section)  Progress towards OT goals: Goals met/education completed, patient discharged from [place] goals met and education completed, patient discharged from OT services       End of Session Equipment Utilized During Treatment: Other (comment) (sling)   Activity Tolerance Patient tolerated treatment well   Patient Left in chair;with call bell/phone within reach;with family/visitor present   Nurse Communication Other (comment) (pt ready for d/c from OT standpoint)        Time: 9629-5284 OT Time Calculation (min): 39 min  Charges: OT General Charges $OT Visit: 1 Procedure OT Treatments $Self Care/Home Management : 23-37 mins $Therapeutic Exercise: 8-22 mins  Juluis Rainier [phone] 04/22/2014, 9:22 AM

## 2014-04-22 NOTE — Progress Notes (Signed)
Pt stable Pain ok Plan dc today - needs mepilex

## 2014-04-27 ENCOUNTER — Encounter (HOSPITAL_COMMUNITY): Payer: Self-pay | Admitting: Orthopedic Surgery

## 2014-04-29 IMAGING — CT CT 3D INDEPENDENT WKST
3 of 4 series · 17 of 36 positions shown, 19 images · non-contrast
Comparison: 04/05/2014

CLINICAL DATA: Nonspecific (abnormal) findings on radiological and
other examination of musculoskeletal sysem. Comminuted proximal
humeral fracture.

EXAM:
CT OF THE RIGHT HUMERUS WITHOUT CONTRAST; 3-DIMENSIONAL CT IMAGE
RENDERING ON INDEPENDENT WORKSTATION
TECHNIQUE: Multidetector CT imaging was performed according to the standard
protocol. Multiplanar CT image reconstructions were also generated.;
3-dimensional CT images were rendered by post-processing of the
original CT data on an independent workstation. The 3-dimensional CT
images were interpreted and findings were reported in the
accompanying complete CT report for this study

[Series 3: rt humerus std · axial · 0.47mm/px · z∈[-166,-52]mm · 8 of 56 slices shown, 10 images]
[im 5/56  soft-tissue]
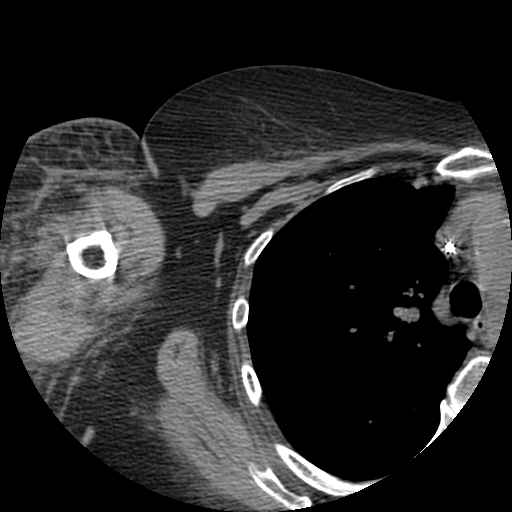
[im 5/56  bone]
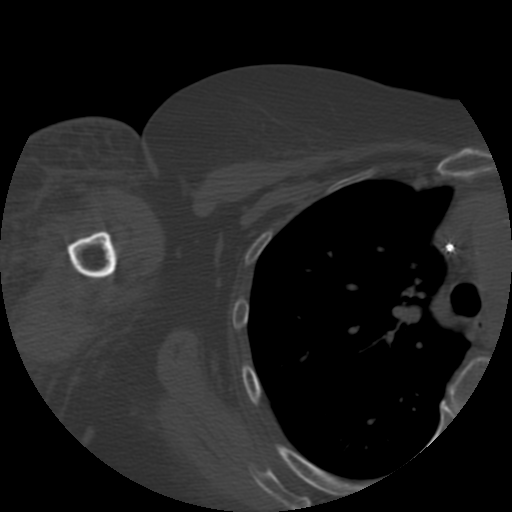
[im 13/56  bone]
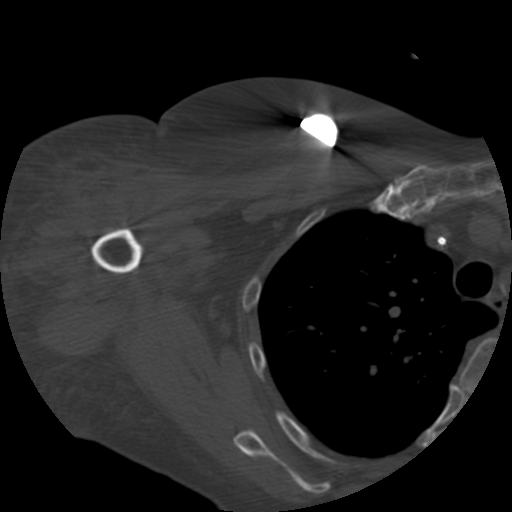
[im 17/56  bone]
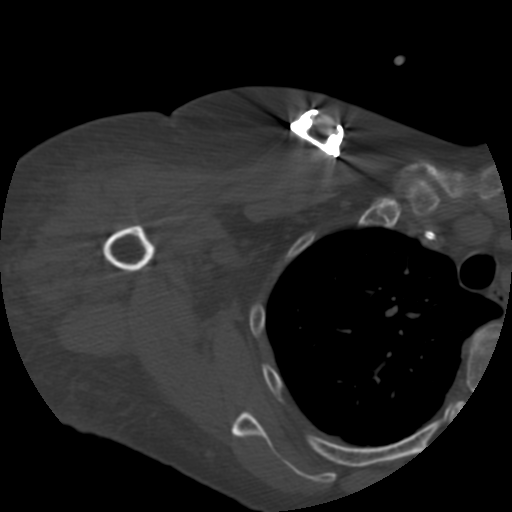
[im 26/56  bone]
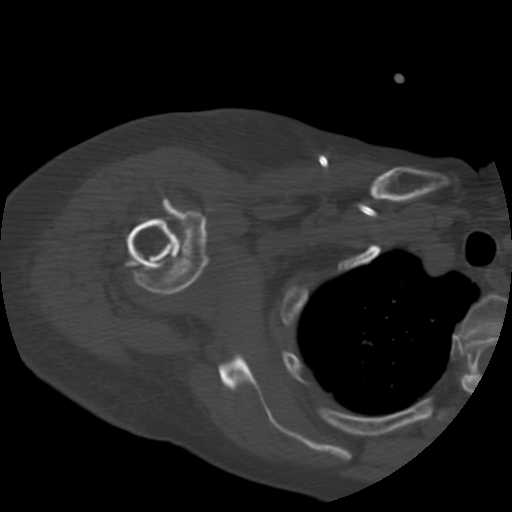
[im 30/56  soft-tissue]
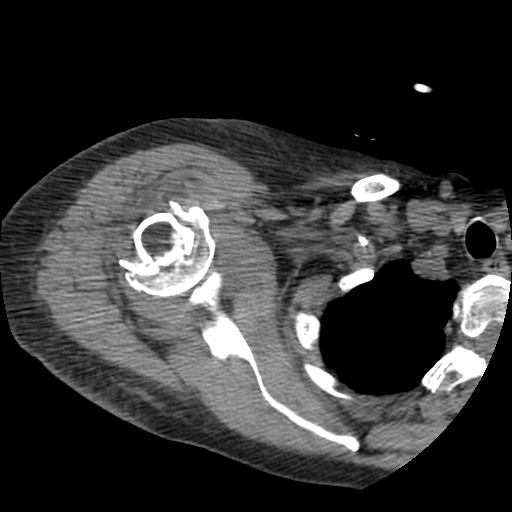
[im 30/56  bone]
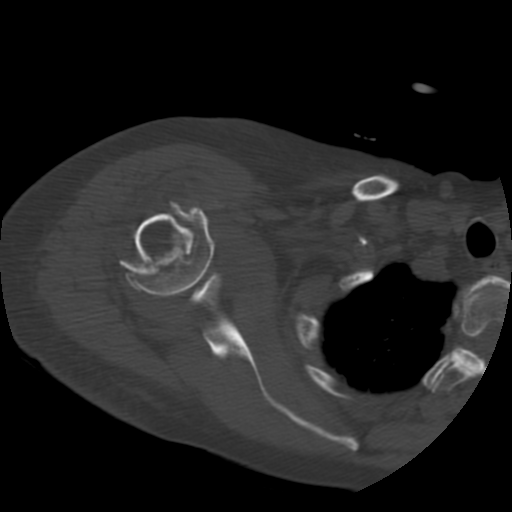
[im 39/56  bone]
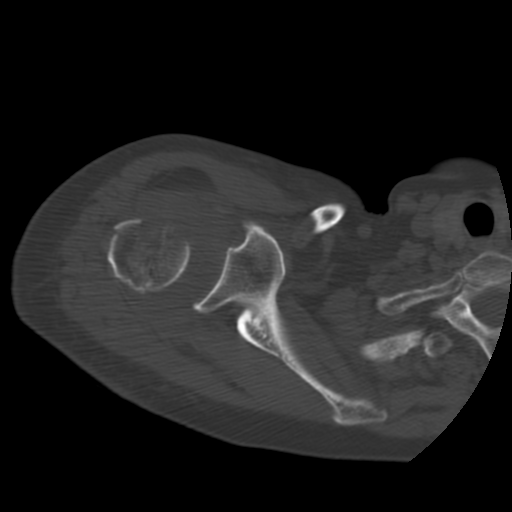
[im 43/56  bone]
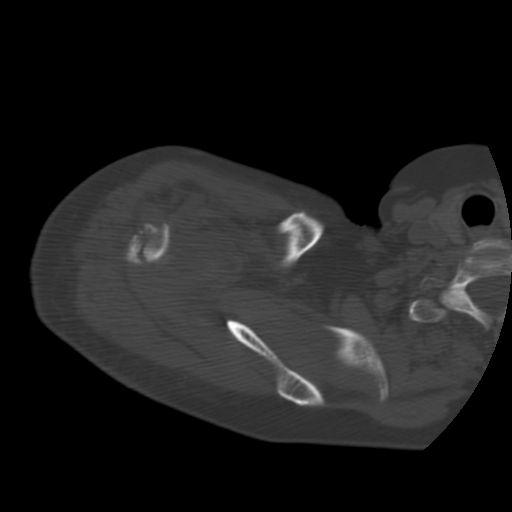
[im 51/56  bone]
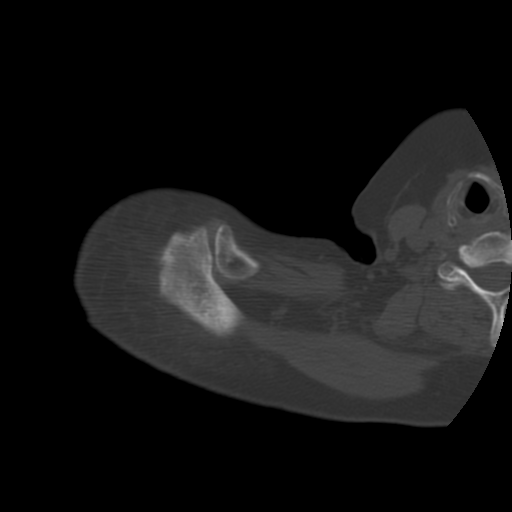

[Series 104: cor rt humerus /soft · coronal · 0.47mm/px · 3 of 98 slices shown]
[im 20/98  bone]
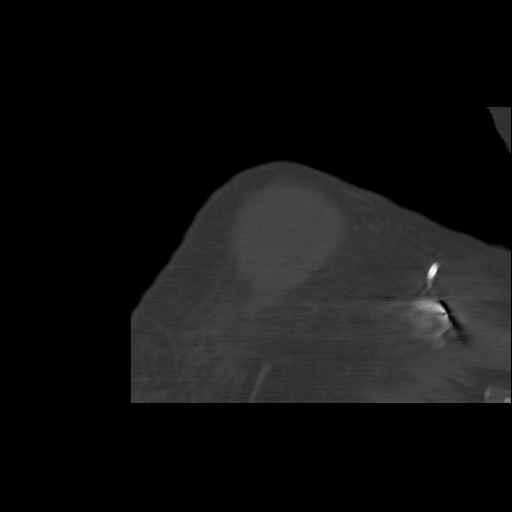
[im 39/98  bone]
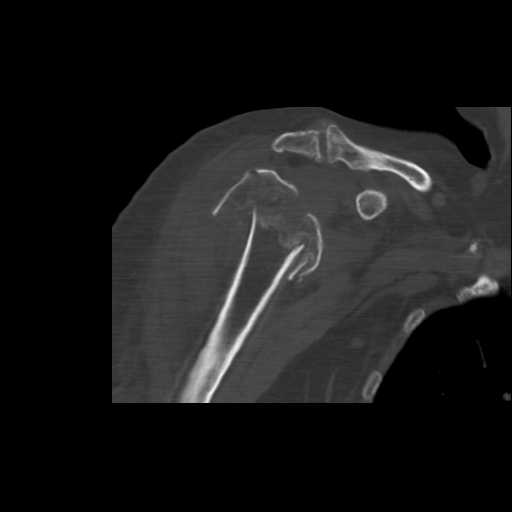
[im 59/98  bone]
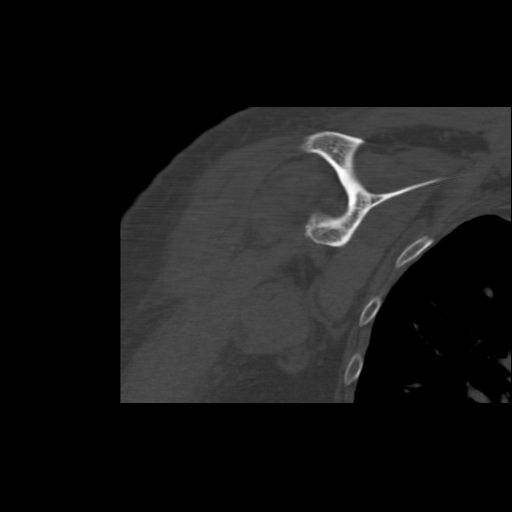

[Series 400: sag rt humerus · sagittal · 0.47mm/px · 6 of 123 slices shown]
[im 49/123  bone]
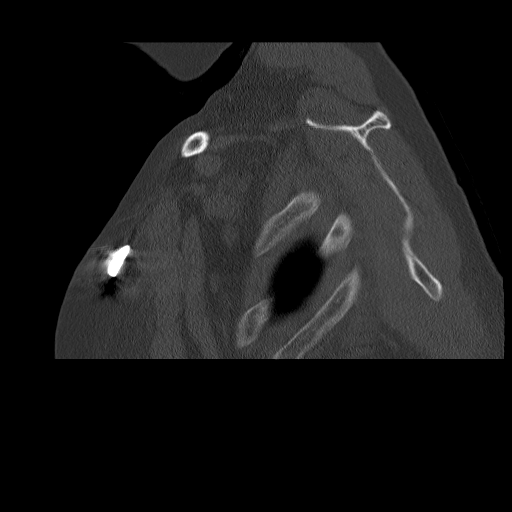
[im 62/123  bone]
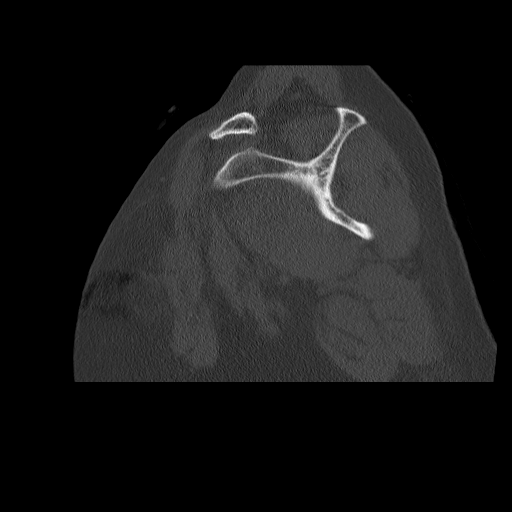
[im 68/123  soft-tissue]
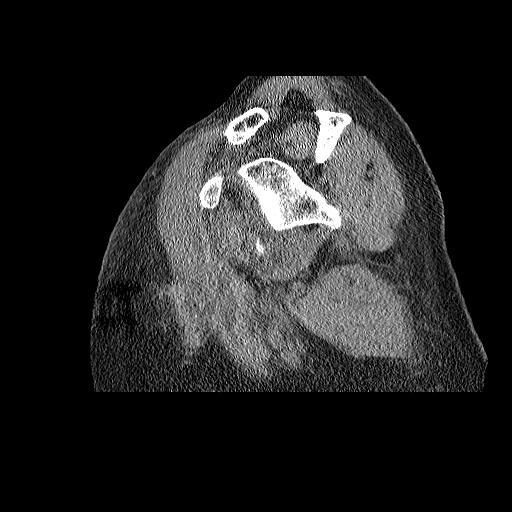
[im 75/123  bone]
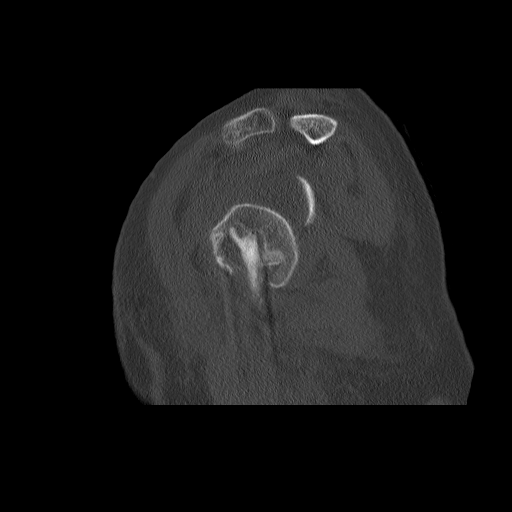
[im 88/123  bone]
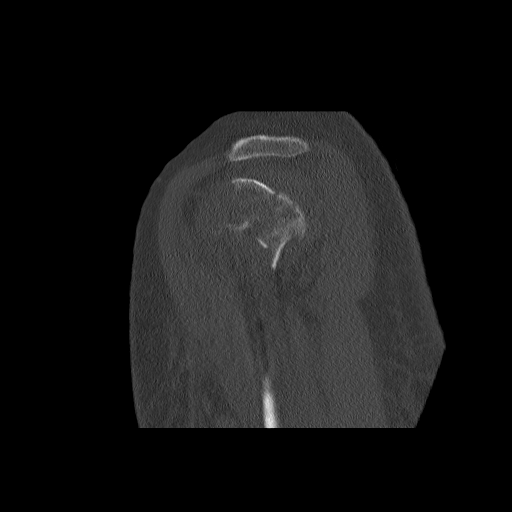
[im 101/123  bone]
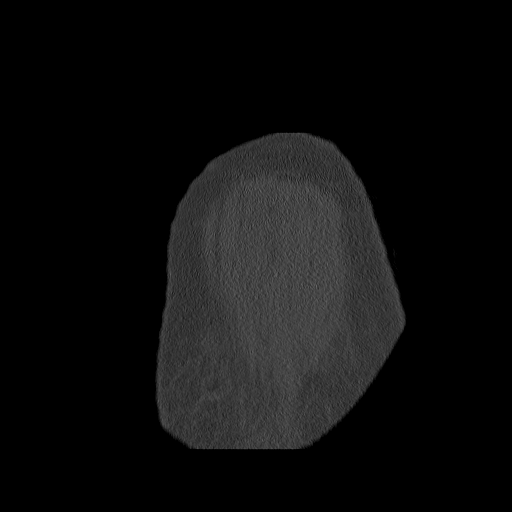

[17 of 36 positions shown; findings below may reference images not displayed]

FINDINGS: There is a severely comminuted, displaced and impacted fracture of
the surgical neck of the right proximal humerus. The fracture cleft
involves the greater tuberosity, lesser tuberosity and articular
surface of the humeral head. The greater tuberosity appears to be
displaced laterally by 17 mm. There is approximately 17 mm of
impaction and foreshortening. There is no glenohumeral dislocation.
There is inferior subluxation of the humeral head relative to the
glenoid likely secondary to the presence of a joint effusion.

There are degenerative changes of the acromioclavicular joint. Type
II acromion. No glenoid fracture. No scapular fracture.

The visualized right lung is clear.

Right-sided Port-A-Cath is noted.
IMPRESSION: Severely comminuted and impacted fracture of the surgical neck of
the right proximal humerus.

## 2014-05-04 NOTE — Discharge Summary (Signed)
Physician Discharge Summary  Patient ID: Kristine Horn MRN: [id number] DOB/AGE: Aug 10, 1941 73 y.o.  Admit date: 04/20/2014 Discharge date: 04/22/2014 Admission Diagnoses:  Active Problems:   Proximal humerus fracture   Discharge Diagnoses:  Same  Surgeries: Procedure(s): SHOULDER HEMI-ARTHROPLASTY CEMENTED on 04/20/2014   Consultants:    Discharged Condition: Stable  Hospital Course: Kristine Horn is an 73 y.o. female who was admitted 04/20/2014 with a chief complaint of shoulder pain, and found to have a diagnosis of displaced proximal humerus fracture.  They were brought to the operating room on 04/20/2014 and underwent the above named procedures.   Tolerated well and transferred home in good condition Antibiotics given:  Anti-infectives   Start     Dose/Rate Route Frequency Ordered Stop   04/20/14 1830  ceFAZolin (ANCEF) IVPB 1 g/50 mL premix     1 g 100 mL/hr over 30 Minutes Intravenous Every 6 hours 04/20/14 1745 04/21/14 0655   04/20/14 0600  ceFAZolin (ANCEF) IVPB 2 g/50 mL premix     2 g 100 mL/hr over 30 Minutes Intravenous On call to O.R. 04/19/14 1441 04/20/14 1148    .  Recent vital signs:  Filed Vitals:   04/22/14 1043  BP: 113/48  Pulse: 74  Temp:   Resp:     Recent laboratory studies:  Results for orders placed during the hospital encounter of 04/20/14  CBC      Result Value Ref Range   WBC 6.0  4.0 - 10.5 K/uL   RBC 2.83 (*) 3.87 - 5.11 MIL/uL   Hemoglobin 9.3 (*) 12.0 - 15.0 g/dL   HCT 27.7 (*) 36.0 - 46.0 %   MCV 97.9  78.0 - 100.0 fL   MCH 32.9  26.0 - 34.0 pg   MCHC 33.6  30.0 - 36.0 g/dL   RDW 17.2 (*) 11.5 - 15.5 %   Platelets 218  150 - 400 K/uL  BASIC METABOLIC PANEL      Result Value Ref Range   Sodium 135 (*) 137 - 147 mEq/L   Potassium 3.6 (*) 3.7 - 5.3 mEq/L   Chloride 100  96 - 112 mEq/L   CO2 23  19 - 32 mEq/L   Glucose, Bld 130 (*) 70 - 99 mg/dL   BUN 13  6 - 23 mg/dL   Creatinine, Ser 0.87  0.50 - 1.10 mg/dL   Calcium 8.6   8.4 - 10.5 mg/dL   GFR calc non Af Amer 65 (*) >90 mL/min   GFR calc Af Amer 75 (*) >90 mL/min  POCT I-STAT 4, (NA,K, GLUC, HGB,HCT)      Result Value Ref Range   Sodium 138  137 - 147 mEq/L   Potassium 3.8  3.7 - 5.3 mEq/L   Glucose, Bld 111 (*) 70 - 99 mg/dL   HCT 23.0 (*) 36.0 - 46.0 %   Hemoglobin 7.8 (*) 12.0 - 15.0 g/dL  POCT I-STAT 4, (NA,K, GLUC, HGB,HCT)      Result Value Ref Range   Sodium 139  137 - 147 mEq/L   Potassium 3.7  3.7 - 5.3 mEq/L   Glucose, Bld 104 (*) 70 - 99 mg/dL   HCT 27.0 (*) 36.0 - 46.0 %   Hemoglobin 9.2 (*) 12.0 - 15.0 g/dL  PREPARE RBC (CROSSMATCH)      Result Value Ref Range   Order Confirmation ORDER PROCESSED BY BLOOD BANK    PREPARE RBC (CROSSMATCH)      Result Value Ref Range  Order Confirmation ORDER PROCESSED BY BLOOD BANK    TYPE AND SCREEN      Result Value Ref Range   ABO/RH(D) A NEG     Antibody Screen NEG     Sample Expiration 04/23/2014     Unit Number [id number]     Blood Component Type RED CELLS,LR     Unit division 00     Status of Unit ISSUED,FINAL     Transfusion Status OK TO TRANSFUSE     Crossmatch Result Compatible     Unit Number [id number]     Blood Component Type RED CELLS,LR     Unit division 00     Status of Unit ISSUED,FINAL     Transfusion Status OK TO TRANSFUSE     Crossmatch Result Compatible    PREPARE RBC (CROSSMATCH)      Result Value Ref Range   Order Confirmation ORDER PROCESSED BY BLOOD BANK    PREPARE RBC (CROSSMATCH)      Result Value Ref Range   Order Confirmation ORDER PROCESSED BY BLOOD BANK      Discharge Medications:     Medication List    STOP taking these medications       oxyCODONE-acetaminophen 5-325 MG per tablet  Commonly known as:  PERCOCET/ROXICET      TAKE these medications       aspirin 325 MG tablet  Take 1 tablet (325 mg total) by mouth daily.     CALCIUM 1200 PO  Take 1,200 mg by mouth 2 (two) times daily.     erlotinib 25 MG tablet  Commonly known as:   TARCEVA  Take 50 mg by mouth daily. Take on an empty stomach 1 hour before meals or 2 hours after.     folic acid 1 MG tablet  Commonly known as:  FOLVITE  Take 1 mg by mouth daily.     hydroxychloroquine 200 MG tablet  Commonly known as:  PLAQUENIL  Take 200 mg by mouth 2 (two) times daily.     ibuprofen 200 MG tablet  Commonly known as:  ADVIL,MOTRIN  Take 600 mg by mouth every 6 (six) hours as needed.     methotrexate 2.5 MG tablet  Take 15 mg by mouth once a week. Friday     multivitamin with minerals Tabs tablet  Take 1 tablet by mouth 2 (two) times daily.     ondansetron 4 MG tablet  Commonly known as:  ZOFRAN  Take 4 mg by mouth every 8 (eight) hours as needed for nausea or vomiting.     oxyCODONE 5 MG immediate release tablet  Commonly known as:  Oxy IR/ROXICODONE  Take 1-2 tablets (5-10 mg total) by mouth every 3 (three) hours as needed for breakthrough pain.     polyethylene glycol packet  Commonly known as:  MIRALAX / GLYCOLAX  Take 17 g by mouth daily.     senna-docusate 8.6-50 MG per tablet  Commonly known as:  Senokot-S  Take 1 tablet by mouth 2 (two) times daily.     simvastatin 20 MG tablet  Commonly known as:  ZOCOR  Take 20 mg by mouth every evening.     triamterene-hydrochlorothiazide 37.5-25 MG per tablet  Commonly known as:  MAXZIDE-25  Take 1 tablet by mouth daily.     verapamil 240 MG CR tablet  Commonly known as:  CALAN-SR  Take 240 mg by mouth daily.        Diagnostic Studies: Dg Chest 2 View  04/16/2014   CLINICAL DATA:  Preop for right shoulder hemiarthroplasty  EXAM: CHEST  2 VIEW  COMPARISON:  PA and lateral chest x-ray dated January 09, 2013  FINDINGS: The lungs are mildly hyperinflated. There are coarse interstitial markings bilaterally. There is new linear density in the left mid lung that likely reflects subsegmental atelectasis. There is no pleural effusion. The cardiac silhouette is top-normal in size. The pulmonary vascularity  is not engorged. The Port-A-Cath appliance tip lies in the proximal SVC. The mediastinum is normal in width. The observed portions of the bony thorax exhibit no acute abnormalities. The right shoulder is not included in the field of view.  IMPRESSION: There is hyperinflation consistent with COPD. There is likely subsegmental atelectasis versus scarring in the left mid lung. There is no evidence of CHF.   Electronically Signed   By: David  Martinique   On: 04/16/2014 16:19   Dg Shoulder 1v Right  04/20/2014   CLINICAL DATA:  Postop  EXAM: RIGHT SHOULDER - 1 VIEW  COMPARISON:  DG SHOULDER *R* dated 04/20/2014; CT HUMERUS*R* W/O CM dated 04/07/2014  FINDINGS: Right hemiarthroplasty.  No adverse features.  IMPRESSION: As above.   Electronically Signed   By: Rolla Flatten M.D.   On: 04/20/2014 13:41   Dg Shoulder Right  04/20/2014   CLINICAL DATA:  Right shoulder replacement.  EXAM: RIGHT SHOULDER - 2+ VIEW; DG C-ARM 61-120 MIN  COMPARISON:  CT scan right shoulder 04/07/2014.  FINDINGS: We are provided with 2 fluoroscopic spot views of the right shoulder. Images demonstrate a hemiarthroplasty device in place. No acute abnormality is identified.  IMPRESSION: Right shoulder replacement.   Electronically Signed   By: Inge Rise M.D.   On: 04/20/2014 13:41   Dg Shoulder Right  04/05/2014   CLINICAL DATA:  [place], pain  EXAM: RIGHT SHOULDER - 2+ VIEW  COMPARISON:  None.  FINDINGS: There is a comminuted humeral head fracture also involving transverse fracture of the humeral neck. There is slight impaction. No glenohumeral dislocation. Lipohemarthrosis.  IMPRESSION: Comminuted humeral head fracture with impaction. Pseudosubluxation. Lipohemarthrosis.   Electronically Signed   By: Rolla Flatten M.D.   On: 04/05/2014 12:36   Ct Humerus Right Wo Contrast  04/07/2014   CLINICAL DATA:  Nonspecific (abnormal) findings on radiological and other examination of musculoskeletal sysem. Comminuted proximal humeral fracture.  EXAM: CT  OF THE RIGHT HUMERUS WITHOUT CONTRAST; 3-DIMENSIONAL CT IMAGE RENDERING ON INDEPENDENT WORKSTATION  TECHNIQUE: Multidetector CT imaging was performed according to the standard protocol. Multiplanar CT image reconstructions were also generated.; 3-dimensional CT images were rendered by post-processing of the original CT data on an independent workstation. The 3-dimensional CT images were interpreted and findings were reported in the accompanying complete CT report for this study  COMPARISON:  04/05/2014  FINDINGS: There is a severely comminuted, displaced and impacted fracture of the surgical neck of the right proximal humerus. The fracture cleft involves the greater tuberosity, lesser tuberosity and articular surface of the humeral head. The greater tuberosity appears to be displaced laterally by 17 mm. There is approximately 17 mm of impaction and foreshortening. There is no glenohumeral dislocation. There is inferior subluxation of the humeral head relative to the glenoid likely secondary to the presence of a joint effusion.  There are degenerative changes of the acromioclavicular joint. Type II acromion. No glenoid fracture. No scapular fracture.  The visualized right lung is clear.  Right-sided Port-A-Cath is noted.  IMPRESSION: Severely comminuted and impacted fracture of the surgical  neck of the right proximal humerus.   Electronically Signed   By: Kathreen Devoid   On: 04/07/2014 11:26   Ct 3d Independent Darreld Mclean  04/07/2014   CLINICAL DATA:  Nonspecific (abnormal) findings on radiological and other examination of musculoskeletal sysem. Comminuted proximal humeral fracture.  EXAM: CT OF THE RIGHT HUMERUS WITHOUT CONTRAST; 3-DIMENSIONAL CT IMAGE RENDERING ON INDEPENDENT WORKSTATION  TECHNIQUE: Multidetector CT imaging was performed according to the standard protocol. Multiplanar CT image reconstructions were also generated.; 3-dimensional CT images were rendered by post-processing of the original CT data on an  independent workstation. The 3-dimensional CT images were interpreted and findings were reported in the accompanying complete CT report for this study  COMPARISON:  04/05/2014  FINDINGS: There is a severely comminuted, displaced and impacted fracture of the surgical neck of the right proximal humerus. The fracture cleft involves the greater tuberosity, lesser tuberosity and articular surface of the humeral head. The greater tuberosity appears to be displaced laterally by 17 mm. There is approximately 17 mm of impaction and foreshortening. There is no glenohumeral dislocation. There is inferior subluxation of the humeral head relative to the glenoid likely secondary to the presence of a joint effusion.  There are degenerative changes of the acromioclavicular joint. Type II acromion. No glenoid fracture. No scapular fracture.  The visualized right lung is clear.  Right-sided Port-A-Cath is noted.  IMPRESSION: Severely comminuted and impacted fracture of the surgical neck of the right proximal humerus.   Electronically Signed   By: Kathreen Devoid   On: 04/07/2014 11:26   Dg Humerus Right  04/05/2014   CLINICAL DATA:  [place] playing tennis.  EXAM: RIGHT HUMERUS - 2+ VIEW  COMPARISON:  Concurrent shoulder radiograph  FINDINGS: There is a comminuted humeral head fracture across the neck of the humerus described under the shoulder dictation. The mid and distal humerus is intact.  IMPRESSION: Comminuted humeral head fracture   Electronically Signed   By: Rolla Flatten M.D.   On: 04/05/2014 12:20   Dg C-arm 61-120 Min  04/20/2014   CLINICAL DATA:  Right shoulder replacement.  EXAM: RIGHT SHOULDER - 2+ VIEW; DG C-ARM 61-120 MIN  COMPARISON:  CT scan right shoulder 04/07/2014.  FINDINGS: We are provided with 2 fluoroscopic spot views of the right shoulder. Images demonstrate a hemiarthroplasty device in place. No acute abnormality is identified.  IMPRESSION: Right shoulder replacement.   Electronically Signed   By: Inge Rise M.D.   On: 04/20/2014 13:41    Disposition: 01-Home or Self Care      Discharge Orders   Future Orders Complete By Expires   Call MD / Call 911  As directed    Constipation Prevention  As directed    Diet - low sodium heart healthy  As directed    Discharge instructions  As directed    Increase activity slowly as tolerated  As directed          Signed: Meredith Pel 05/04/2014, 7:29 AM

## 2019-05-01 DEATH — deceased
